# Patient Record
Sex: Female | Born: 1965 | Hispanic: Yes | Marital: Single | Smoking: Current every day smoker
Health system: Southern US, Community
[De-identification: ages and names within clinical notes are randomized; demographics above are authoritative.]

## PROBLEM LIST (undated history)

## (undated) DIAGNOSIS — I10 Essential (primary) hypertension: Secondary | ICD-10-CM

## (undated) DIAGNOSIS — F32A Depression, unspecified: Secondary | ICD-10-CM

## (undated) DIAGNOSIS — F1020 Alcohol dependence, uncomplicated: Secondary | ICD-10-CM

## (undated) DIAGNOSIS — F319 Bipolar disorder, unspecified: Secondary | ICD-10-CM

## (undated) DIAGNOSIS — K219 Gastro-esophageal reflux disease without esophagitis: Secondary | ICD-10-CM

## (undated) DIAGNOSIS — K769 Liver disease, unspecified: Secondary | ICD-10-CM

## (undated) DIAGNOSIS — K859 Acute pancreatitis without necrosis or infection, unspecified: Secondary | ICD-10-CM

## (undated) DIAGNOSIS — F329 Major depressive disorder, single episode, unspecified: Secondary | ICD-10-CM

## (undated) HISTORY — PX: LAPAROSCOPIC UNILATERAL SALPINGO OOPHERECTOMY: SHX5935

## (undated) HISTORY — PX: OVARIAN CYST REMOVAL: SHX89

## (undated) HISTORY — PX: WISDOM TOOTH EXTRACTION: SHX21

## (undated) HISTORY — DX: Liver disease, unspecified: K76.9

## (undated) HISTORY — PX: APPENDECTOMY: SHX54

---

## 2014-06-06 ENCOUNTER — Other Ambulatory Visit: Payer: Self-pay | Admitting: Family Medicine

## 2014-06-06 DIAGNOSIS — R160 Hepatomegaly, not elsewhere classified: Secondary | ICD-10-CM

## 2014-07-04 ENCOUNTER — Other Ambulatory Visit: Payer: Self-pay

## 2014-07-18 DIAGNOSIS — K859 Acute pancreatitis without necrosis or infection, unspecified: Secondary | ICD-10-CM | POA: Insufficient documentation

## 2014-07-18 HISTORY — DX: Acute pancreatitis without necrosis or infection, unspecified: K85.90

## 2014-07-19 ENCOUNTER — Encounter (HOSPITAL_COMMUNITY): Payer: Self-pay

## 2014-07-19 ENCOUNTER — Emergency Department (HOSPITAL_COMMUNITY)
Admission: EM | Admit: 2014-07-19 | Discharge: 2014-07-19 | Disposition: A | Discharge status: Home / Self Care | Payer: Self-pay | Attending: Emergency Medicine | Admitting: Emergency Medicine

## 2014-07-19 DIAGNOSIS — I10 Essential (primary) hypertension: Secondary | ICD-10-CM | POA: Insufficient documentation

## 2014-07-19 DIAGNOSIS — Y908 Blood alcohol level of 240 mg/100 ml or more: Secondary | ICD-10-CM | POA: Insufficient documentation

## 2014-07-19 DIAGNOSIS — K921 Melena: Secondary | ICD-10-CM | POA: Insufficient documentation

## 2014-07-19 DIAGNOSIS — F319 Bipolar disorder, unspecified: Secondary | ICD-10-CM | POA: Insufficient documentation

## 2014-07-19 DIAGNOSIS — F10920 Alcohol use, unspecified with intoxication, uncomplicated: Secondary | ICD-10-CM

## 2014-07-19 DIAGNOSIS — F10120 Alcohol abuse with intoxication, uncomplicated: Secondary | ICD-10-CM | POA: Insufficient documentation

## 2014-07-19 DIAGNOSIS — Z79899 Other long term (current) drug therapy: Secondary | ICD-10-CM | POA: Insufficient documentation

## 2014-07-19 HISTORY — DX: Bipolar disorder, unspecified: F31.9

## 2014-07-19 HISTORY — DX: Essential (primary) hypertension: I10

## 2014-07-19 LAB — COMPREHENSIVE METABOLIC PANEL
ALK PHOS: 168 U/L — AB (ref 39–117)
ALT: 85 U/L — AB (ref 0–35)
AST: 212 U/L — AB (ref 0–37)
Albumin: 4.3 g/dL (ref 3.5–5.2)
Anion gap: 16 — ABNORMAL HIGH (ref 5–15)
BUN: 6 mg/dL (ref 6–23)
CALCIUM: 8.7 mg/dL (ref 8.4–10.5)
CO2: 20 mmol/L (ref 19–32)
Chloride: 101 mmol/L (ref 96–112)
Creatinine, Ser: 0.51 mg/dL (ref 0.50–1.10)
GFR calc Af Amer: >90 mL/min (ref >90)
GFR calc non Af Amer: >90 mL/min (ref >90)
Glucose, Bld: 92 mg/dL (ref 70–99)
Potassium: 2.7 mmol/L — CL (ref 3.5–5.1)
SODIUM: 137 mmol/L (ref 135–145)
Total Bilirubin: 1.2 mg/dL (ref 0.3–1.2)
Total Protein: 8 g/dL (ref 6.0–8.3)

## 2014-07-19 LAB — CBC WITH DIFFERENTIAL/PLATELET
BASOS ABS: 0.1 10*3/uL (ref 0.0–0.1)
Basophils Relative: 0 % (ref 0–1)
EOS PCT: 2 % (ref 0–5)
Eosinophils Absolute: 0.2 10*3/uL (ref 0.0–0.7)
HEMATOCRIT: 36.3 % (ref 36.0–46.0)
Hemoglobin: 12.3 g/dL (ref 12.0–15.0)
LYMPHS PCT: 28 % (ref 12–46)
Lymphs Abs: 3.2 10*3/uL (ref 0.7–4.0)
MCH: 33.4 pg (ref 26.0–34.0)
MCHC: 33.9 g/dL (ref 30.0–36.0)
MCV: 98.6 fL (ref 78.0–100.0)
Monocytes Absolute: 1 10*3/uL (ref 0.1–1.0)
Monocytes Relative: 9 % (ref 3–12)
NEUTROS ABS: 6.9 10*3/uL (ref 1.7–7.7)
Neutrophils Relative %: 61 % (ref 43–77)
Platelets: 313 10*3/uL (ref 150–400)
RBC: 3.68 MIL/uL — AB (ref 3.87–5.11)
RDW: 13.4 % (ref 11.5–15.5)
WBC: 11.3 10*3/uL — ABNORMAL HIGH (ref 4.0–10.5)

## 2014-07-19 LAB — LIPASE, BLOOD: Lipase: 52 U/L (ref 11–59)

## 2014-07-19 LAB — ETHANOL: Alcohol, Ethyl (B): 442 mg/dL (ref 0–9)

## 2014-07-19 MED ORDER — FOLIC ACID 1 MG PO TABS
1.0000 mg | ORAL_TABLET | Freq: Once | ORAL | Status: AC
  Administered 2014-07-19: 1 mg via ORAL
  Filled 2014-07-19: qty 1

## 2014-07-19 MED ORDER — SODIUM CHLORIDE 0.9 % IV BOLUS (SEPSIS)
1000.0000 mL | Freq: Once | INTRAVENOUS | Status: AC
  Administered 2014-07-19: 1000 mL via INTRAVENOUS

## 2014-07-19 MED ORDER — POTASSIUM CHLORIDE 10 MEQ/100ML IV SOLN
10.0000 meq | INTRAVENOUS | Status: DC
  Administered 2014-07-19: 10 meq via INTRAVENOUS
  Filled 2014-07-19: qty 100

## 2014-07-19 MED ORDER — VITAMIN B-1 100 MG PO TABS
500.0000 mg | ORAL_TABLET | Freq: Once | ORAL | Status: AC
  Administered 2014-07-19: 500 mg via ORAL
  Filled 2014-07-19: qty 5

## 2014-07-19 MED ORDER — ONDANSETRON HCL 4 MG/2ML IJ SOLN
4.0000 mg | Freq: Once | INTRAMUSCULAR | Status: AC
  Administered 2014-07-19: 4 mg via INTRAVENOUS
  Filled 2014-07-19: qty 2

## 2014-07-19 MED ORDER — POTASSIUM CHLORIDE CRYS ER 20 MEQ PO TBCR
40.0000 meq | EXTENDED_RELEASE_TABLET | Freq: Once | ORAL | Status: AC
  Administered 2014-07-19: 40 meq via ORAL
  Filled 2014-07-19: qty 2

## 2014-07-19 NOTE — ED Notes (Signed)
Pt requesting to go home. Pt has a neighbor coming to get her.  Pt ambulating around room with steady gait.  Made [facility] EDP aware.  Pt ok top go home.

## 2014-07-19 NOTE — ED Notes (Signed)
Per EMS pt from home and pt called 911 because she believed she was having a seizure because her "phone looked blurry". Pt intoxicated and drinking vodka and smoking pot. Pt in NAD. CBG 117 HR 80's BP 160/90.

## 2014-07-19 NOTE — ED Notes (Signed)
Pt using phone to call her daughter.

## 2014-07-19 NOTE — Discharge Instructions (Signed)
Alcohol Intoxication Alcohol intoxication occurs when you drink enough alcohol that it affects your ability to function. It can be mild or very severe. Drinking a lot of alcohol in a short time is called binge drinking. This can be very harmful. Drinking alcohol can also be more dangerous if you are taking medicines or other drugs. Some of the effects caused by alcohol may include:  Loss of coordination.  Changes in mood and behavior.  Unclear thinking.  Trouble talking (slurred speech).  Throwing up (vomiting).  Confusion.  Slowed breathing.  Twitching and shaking (seizures).  Loss of consciousness. HOME CARE  Do not drive after drinking alcohol.  Drink enough water and fluids to keep your pee (urine) clear or pale yellow. Avoid caffeine.  Only take medicine as told by your doctor. GET HELP IF:  You throw up (vomit) many times.  You do not feel better after a few days.  You frequently have alcohol intoxication. Your doctor can help decide if you should see a substance use treatment counselor. GET HELP RIGHT AWAY IF:  You become shaky when you stop drinking.  You have twitching and shaking.  You throw up blood. It may look bright red or like coffee grounds.  You notice blood in your poop (bowel movements).  You become lightheaded or pass out (faint). MAKE SURE YOU:   Understand these instructions.  Will watch your condition.  Will get help right away if you are not doing well or get worse. Document Released: 11/20/2007 Document Revised: 02/03/2013 Document Reviewed: 11/06/2012 [facility] Patient Information 2015 [place], [place]. This information is not intended to replace advice given to you by your health care provider. Make sure you discuss any questions you have with your health care provider.  Alcohol and Nutrition Nutrition serves two purposes. It provides energy. It also maintains body structure and function. Food supplies energy. It also provides the  building blocks needed to replace worn or damaged cells. Alcoholics often eat poorly. This limits their supply of essential nutrients. This affects energy supply and structure maintenance. Alcohol also affects the body's nutrients in:  Digestion.  Storage.  Using and getting rid of waste products. IMPAIRMENT OF NUTRIENT DIGESTION AND UTILIZATION   Once ingested, food must be broken down into small components (digested). Then it is available for energy. It helps maintain body structure and function. Digestion begins in the mouth. It continues in the stomach and intestines, with help from the pancreas. The nutrients from digested food are absorbed from the intestines into the blood. Then they are carried to the liver. The liver prepares nutrients for:  Immediate use.  Storage and future use.  Alcohol inhibits the breakdown of nutrients into usable molecules.  It decreases secretion of digestive enzymes from the pancreas.  Alcohol impairs nutrient absorption by damaging the cells lining the stomach and intestines.  It also interferes with moving some nutrients into the blood.  In addition, nutritional deficiencies themselves may lead to further absorption problems.  For example, folate deficiency changes the cells that line the small intestine. This impairs how water is absorbed. It also affects absorbed nutrients. These include glucose, sodium, and additional folate.  Even if nutrients are digested and absorbed, alcohol can prevent them from being fully used. It changes their transport, storage, and excretion. Impaired utilization of nutrients by alcoholics is indicated by:  Decreased liver stores of vitamins, such as vitamin A.  Increased excretion of nutrients such as fat. ALCOHOL AND ENERGY SUPPLY   Three basic nutritional  components found in food are:  Carbohydrates.  Proteins.  Fats.  These are used as energy. Some alcoholics take in as much as 50% of their total daily  calories from alcohol. They often neglect important foods.  Even when enough food is eaten, alcohol can impair the ways the body controls blood sugar (glucose) levels. It may either increase or decrease blood sugar.  In non-diabetic alcoholics, increased blood sugar (hyperglycemia) is caused by poor insulin secretion. It is usually temporary.  Decreased blood sugar (hypoglycemia) can cause serious injury even if this condition is short-lived. Low blood sugar can happen when a fasting or malnourished person drinks alcohol. When there is no food to supply energy, stored sugar is used up. The products of alcohol inhibit forming glucose from other compounds such as amino acids. As a result, alcohol causes the brain and other body tissue to lack glucose. It is needed for energy and function.  Alcohol is an energy source. But how the body processes and uses the energy from alcohol is complex. Also, when alcohol is substituted for carbohydrates, subjects tend to lose weight. This indicates that they get less energy from alcohol than from food. ALCOHOL - MAINTAINING CELL STRUCTURE AND FUNCTION  Structure Cells are made mostly of protein. So an adequate protein diet is important for maintaining cell structure. This is especially true if cells are being damaged. Research indicates that alcohol affects protein nutrition by causing impaired:  Digestion of proteins to amino acids.  Processing of amino acids by the small intestine and liver.  Synthesis of proteins from amino acids.  Protein secretion by the liver. Function Nutrients are essential for the body to function well. They provide the tools that the body needs to work well:   Proteins.  Vitamins.  Minerals. Alcohol can disrupt body function. It may cause nutrient deficiencies. And it may interfere with the way nutrients are processed. Vitamins  Vitamins are essential to maintain growth and normal metabolism. They regulate many of the body`s  processes. Chronic heavy drinking causes deficiencies in many vitamins. This is caused by eating less. And, in some cases, vitamins may be poorly absorbed. For example, alcohol inhibits fat absorption. It impairs how the vitamins A, E, and D are normally absorbed along with dietary fats. Not enough vitamin A may cause night blindness. Not enough vitamin D may cause softening of the bones.  Some alcoholics lack vitamins A, C, D, E, K, and the B vitamins. These are all involved in wound healing and cell maintenance. In particular, because vitamin K is necessary for blood clotting, lacking that vitamin can cause delayed clotting. The result is excess bleeding. Lacking other vitamins involved in brain function may cause severe neurological damage. Minerals Deficiencies of minerals such as calcium, magnesium, iron, and zinc are common in alcoholics. The alcohol itself does not seem to affect how these minerals are absorbed. Rather, they seem to occur secondary to other alcohol-related problems, such as:  Less calcium absorbed.  Not enough magnesium.  More urinary excretion.  Vomiting.  Diarrhea.  Not enough iron due to gastrointestinal bleeding.  Not enough zinc or losses related to other nutrient deficiencies.  Mineral deficiencies can cause a variety of medical consequences. These range from calcium-related bone disease to zinc-related night blindness and skin lesions. ALCOHOL, MALNUTRITION, AND MEDICAL COMPLICATIONS  Liver Disease   Alcoholic liver damage is caused primarily by alcohol itself. But poor nutrition may increase the risk of alcohol-related liver damage. For example, nutrients normally found in  the liver are known to be affected by drinking alcohol. These include carotenoids, which are the major sources of vitamin A, and vitamin E compounds. Decreases in such nutrients may play some role in alcohol-related liver damage. Pancreatitis  Research suggests that malnutrition may  increase the risk of developing alcoholic pancreatitis. Research suggests that a diet lacking in protein may increase alcohol's damaging effect on the pancreas. Brain  Nutritional deficiencies may have severe effects on brain function. These may be permanent. Specifically, thiamine deficiencies are often seen in alcoholics. They can cause severe neurological problems. These include:  Impaired movement.  Memory loss seen in Wernicke-Korsakoff syndrome. Pregnancy  Alcohol has toxic effects on fetal development. It causes alcohol-related birth defects. They include fetal alcohol syndrome. Alcohol itself is toxic to the fetus. Also, the nutritional deficiency can affect how the fetus develops. That may compound the risk of developmental damage.  Nutritional needs during pregnancy are 10% to 30% greater than normal. Food intake can increase by as much as 140% to cover the needs of both mother and fetus. An alcoholic mother`s nutritional problems may adversely affect the nutrition of the fetus. And alcohol itself can also restrict nutrition flow to the fetus. NUTRITIONAL STATUS OF ALCOHOLICS  Techniques for assessing nutritional status include:  Taking body measurements to estimate fat reserves. They include:  Weight.  Height.  Mass.  Skin fold thickness.  Performing blood analysis to provide measurements of circulating:  Proteins.  Vitamins.  Minerals.  These techniques tend to be imprecise. For many nutrients, there is no clear "cut-off" point that would allow an accurate definition of deficiency. So assessing the nutritional status of alcoholics is limited by these techniques. Dietary status may provide information about the risk of developing nutritional problems. Dietary status is assessed by:  Taking patients' dietary histories.  Evaluating the amount and types of food they are eating.  It is difficult to determine what exact amount of alcohol begins to have damaging effects  on nutrition. In general, moderate drinkers have 2 drinks or less per day. They seem to be at little risk for nutritional problems. Various medical disorders begin to appear at greater levels.  Research indicates that the majority of even the heaviest drinkers have few obvious nutritional deficiencies. Many alcoholics who are hospitalized for medical complications of their disease do have severe malnutrition. Alcoholics tend to eat poorly. Often they eat less than the amounts of food necessary to provide enough:  Carbohydrates.  Protein.  Fat.  Vitamins A and C.  B vitamins.  Minerals like calcium and iron. Of major concern is alcohol's effect on digesting food and use of nutrients. It may shift a mildly malnourished person toward severe malnutrition. Document Released: 03/28/2005 Document Revised: 08/26/2011 Document Reviewed: 09/11/2005 [facility] Patient Information 2015 [place], [place]. This information is not intended to replace advice given to you by your health care provider. Make sure you discuss any questions you have with your health care provider.

## 2014-07-19 NOTE — ED Notes (Signed)
MD at bedside. 

## 2014-07-19 NOTE — Progress Notes (Signed)
Pt states pcp is Dr Dorthy Cooler at [facility] inquired about "a sedative to calm me. I am a chronic alcoholic." CM spoke with ED RN, Morey Hummingbird about pt request

## 2014-07-19 NOTE — ED Provider Notes (Signed)
Patient signed out to me to follow her progression for alcohol intoxication. It was felt that she could be discharged when she was more alert and sober. Patient is now alert, oriented and ambulatory. She has a friend that is going to pick her up and will take responsibility for her at time of discharge. She is appropriate for discharge.  Orpah Greek, MD 07/19/14 [phone]

## 2014-07-19 NOTE — ED Provider Notes (Signed)
CSN: [id number]     Arrival date & time 07/19/14  1312 History   First MD Initiated Contact with Patient 07/19/14 1325     Chief Complaint  Patient presents with  . Alcohol Intoxication  . Abdominal Pain     (Consider location/radiation/quality/duration/timing/severity/associated sxs/prior Treatment) Patient is a 49 y.o. female presenting with intoxication and abdominal pain. The history is provided by the patient.  Alcohol Intoxication This is a chronic problem. Episode onset: drinking hard liquor for 20 years.  drank earlier today. The problem occurs constantly. The problem has not changed since onset.Associated symptoms include abdominal pain. Pertinent negatives include no chest pain, no headaches and no shortness of breath. Associated symptoms comments: States today while reading a book her vision became blurry and she was concerned she was having a seizure.  Sx gone now.  Also chronic abd pain on right and vomits every morning. Nothing aggravates the symptoms. Nothing relieves the symptoms. She has tried nothing for the symptoms. The treatment provided no relief.  Abdominal Pain Associated symptoms: nausea and vomiting   Associated symptoms: no chest pain, no dysuria, no fever and no shortness of breath     Past Medical History  Diagnosis Date  . Bipolar 1 disorder   . Hypertension    History reviewed. No pertinent past surgical history. History reviewed. No pertinent family history. History  Substance Use Topics  . Smoking status: Current Every Day Smoker  . Smokeless tobacco: Not on file  . Alcohol Use: Yes     Comment: heavily   OB History    No data available     Review of Systems  Constitutional: Negative for fever.  Eyes: Positive for visual disturbance. Negative for photophobia.  Respiratory: Negative for shortness of breath.   Cardiovascular: Negative for chest pain.  Gastrointestinal: Positive for nausea, vomiting and abdominal pain.       Black stools for 2  weeks  Genitourinary: Negative for dysuria.  Neurological: Negative for weakness and headaches.  All other systems reviewed and are negative.     Allergies  Review of patient's allergies indicates no known allergies.  Home Medications   Prior to Admission medications   Medication Sig Start Date End Date Taking? Authorizing Provider  citalopram (CELEXA) 10 MG tablet Take 10 mg by mouth daily.   Yes Historical Provider, MD  divalproex (DEPAKOTE ER) 500 MG 24 hr tablet Take 500 mg by mouth daily.   Yes Historical Provider, MD  LORazepam (ATIVAN) 0.5 MG tablet Take 0.5 mg by mouth 2 (two) times daily.   Yes Historical Provider, MD  losartan-hydrochlorothiazide (HYZAAR) 100-12.5 MG per tablet Take 1 tablet by mouth daily.   Yes Historical Provider, MD  omeprazole (PRILOSEC) 40 MG capsule Take 40 mg by mouth daily.   Yes Historical Provider, MD   BP 119/80 mmHg  Pulse 88  Temp(Src) 97.6 F (36.4 C) (Oral)  Resp 18  SpO2 100% Physical Exam  Constitutional: She is oriented to person, place, and time. She appears well-developed and well-nourished. She appears distressed.  HENT:  Head: Normocephalic and atraumatic.  Right Ear: Tympanic membrane normal.  Left Ear: Tympanic membrane normal.  Eyes: EOM are normal. Pupils are equal, round, and reactive to light.  Cardiovascular: Normal rate, regular rhythm, normal heart sounds and intact distal pulses.  Exam reveals no friction rub.   No murmur heard. Pulmonary/Chest: Effort normal and breath sounds normal. She has no wheezes. She has no rales.  Abdominal: Soft. Bowel sounds are normal.  She exhibits distension and ascites. There is hepatomegaly. There is tenderness in the right upper quadrant and right lower quadrant. There is no rebound and no guarding. No hernia.  Musculoskeletal: Normal range of motion. She exhibits no tenderness.  No edema  Neurological: She is alert and oriented to person, place, and time. She has normal strength. No  cranial nerve deficit or sensory deficit. Gait abnormal.  Intoxicated on my exam.  Stumbling gait  Skin: Skin is warm and dry. No rash noted.  Psychiatric: She has a normal mood and affect. Her behavior is normal.  Nursing note and vitals reviewed.   ED Course  Procedures (including critical care time) Labs Review Labs Reviewed  COMPREHENSIVE METABOLIC PANEL - Abnormal; Notable for the following:    Potassium 2.7 (*)    AST 212 (*)    ALT 85 (*)    Alkaline Phosphatase 168 (*)    Anion gap 16 (*)    All other components within normal limits  CBC WITH DIFFERENTIAL/PLATELET - Abnormal; Notable for the following:    WBC 11.3 (*)    RBC 3.68 (*)    All other components within normal limits  ETHANOL - Abnormal; Notable for the following:    Alcohol, Ethyl (B) 442 (*)    All other components within normal limits  LIPASE, BLOOD  URINALYSIS, ROUTINE W REFLEX MICROSCOPIC  URINE RAPID DRUG SCREEN (HOSP PERFORMED)  POC URINE PREG, ED    Imaging Review No results found.   EKG Interpretation None      MDM   Final diagnoses:  None    Patient with a history of chronic alcohol abuse who admits to drinking heavily every day hard alcohol who presents today because she felt like she could be having a seizure. She states she was reading and her vision got blurry. She denies loss of consciousness, focal weakness or shaking. She was drinking today and appears to be intoxicated at this time. She is also complaining of right-sided abdominal pain and dark stools. She vomits daily in the mornings but denies any hematemesis. On exam she has hepatomegaly with mild distention and ascites. She is not jaundiced at this time. Vital signs are within normal limits.  CBC, CMP, UA, EtOH, UDS, lipase, UPT pending. Patient does not display any symptoms of stroke and gout seizure today. Patient did casually mentioned detox however will have to wait till she is sober to discuss that further. She continues to  ask the nurse if she can go home however she is having difficulty walking at this time do not feel that she can safely be discharged.  3:33 PM Patient is intoxicated with an alcohol level 442, CMP with evidence of hypokalemia of 2.7 and elevated liver enzymes without old test to compare however AST is greater than ALT (212 and 85).  Normal lipase and hemoglobin. With patient's complaint of black tarry stools for 2 weeks feel that she would have an abnormal hemoglobin if she was having GI bleeding.  3:37 PM Pt checked out to Dr. Betsey Holiday.  Pt replaced with K, thiamine and folic acid.  Blanchie Dessert, MD 07/19/14 1538

## 2014-07-19 NOTE — ED Notes (Signed)
Made [facility] EDP aware pt's critical Potassium and ETOH.   Also made EDP aware of pt requesting sedative to help her calm down.  No new orders at this time.

## 2014-07-19 NOTE — ED Notes (Signed)
Pt is aware that a urine sample is needed.  

## 2014-07-25 ENCOUNTER — Encounter (HOSPITAL_COMMUNITY): Payer: Self-pay | Admitting: Emergency Medicine

## 2014-07-25 ENCOUNTER — Emergency Department (HOSPITAL_COMMUNITY)
Admission: EM | Admit: 2014-07-25 | Discharge: 2014-07-25 | Discharge status: Against Medical Advice | Payer: 59 | Attending: Emergency Medicine | Admitting: Emergency Medicine

## 2014-07-25 DIAGNOSIS — R109 Unspecified abdominal pain: Secondary | ICD-10-CM | POA: Diagnosis not present

## 2014-07-25 DIAGNOSIS — R1110 Vomiting, unspecified: Secondary | ICD-10-CM | POA: Insufficient documentation

## 2014-07-25 DIAGNOSIS — Z720 Tobacco use: Secondary | ICD-10-CM | POA: Insufficient documentation

## 2014-07-25 DIAGNOSIS — I10 Essential (primary) hypertension: Secondary | ICD-10-CM | POA: Diagnosis not present

## 2014-07-25 LAB — CBC WITH DIFFERENTIAL/PLATELET
BASOS ABS: 0 10*3/uL (ref 0.0–0.1)
BASOS PCT: 0 % (ref 0–1)
EOS PCT: 1 % (ref 0–5)
Eosinophils Absolute: 0.1 10*3/uL (ref 0.0–0.7)
HCT: 37.2 % (ref 36.0–46.0)
Hemoglobin: 12.8 g/dL (ref 12.0–15.0)
LYMPHS PCT: 15 % (ref 12–46)
Lymphs Abs: 1.4 10*3/uL (ref 0.7–4.0)
MCH: 33.1 pg (ref 26.0–34.0)
MCHC: 34.4 g/dL (ref 30.0–36.0)
MCV: 96.1 fL (ref 78.0–100.0)
Monocytes Absolute: 0.7 10*3/uL (ref 0.1–1.0)
Monocytes Relative: 7 % (ref 3–12)
NEUTROS ABS: 7 10*3/uL (ref 1.7–7.7)
NEUTROS PCT: 77 % (ref 43–77)
Platelets: 280 10*3/uL (ref 150–400)
RBC: 3.87 MIL/uL (ref 3.87–5.11)
RDW: 13.2 % (ref 11.5–15.5)
WBC: 9.1 10*3/uL (ref 4.0–10.5)

## 2014-07-25 LAB — COMPREHENSIVE METABOLIC PANEL
ALT: 88 U/L — ABNORMAL HIGH (ref 0–35)
AST: 250 U/L — ABNORMAL HIGH (ref 0–37)
Albumin: 4.5 g/dL (ref 3.5–5.2)
Alkaline Phosphatase: 211 U/L — ABNORMAL HIGH (ref 39–117)
Anion gap: 21 — ABNORMAL HIGH (ref 5–15)
BILIRUBIN TOTAL: 2.1 mg/dL — AB (ref 0.3–1.2)
BUN: 5 mg/dL — AB (ref 6–23)
CO2: 16 mmol/L — AB (ref 19–32)
CREATININE: 0.77 mg/dL (ref 0.50–1.10)
Calcium: 9.1 mg/dL (ref 8.4–10.5)
Chloride: 97 mmol/L (ref 96–112)
GFR calc Af Amer: >90 mL/min (ref >90)
Glucose, Bld: 77 mg/dL (ref 70–99)
Potassium: 2.8 mmol/L — ABNORMAL LOW (ref 3.5–5.1)
Sodium: 134 mmol/L — ABNORMAL LOW (ref 135–145)
Total Protein: 8.5 g/dL — ABNORMAL HIGH (ref 6.0–8.3)

## 2014-07-25 LAB — ETHANOL: Alcohol, Ethyl (B): 168 mg/dL — ABNORMAL HIGH (ref 0–9)

## 2014-07-25 LAB — LIPASE, BLOOD: LIPASE: 122 U/L — AB (ref 11–59)

## 2014-07-25 LAB — AMYLASE: Amylase: 96 U/L (ref 0–105)

## 2014-07-25 NOTE — ED Notes (Signed)
Pt c/o right sided abd pain and vomiting with some blood and black stools x 1 week; pt sts abd distention and sts drinks approx a pink of vodka per day

## 2014-07-25 NOTE — ED Notes (Signed)
The patient was called several times to be taken to a room and she did not answer.  The patient left without being seen after triage.

## 2014-07-26 ENCOUNTER — Emergency Department (HOSPITAL_COMMUNITY): Payer: 59

## 2014-07-26 ENCOUNTER — Encounter (HOSPITAL_COMMUNITY): Payer: Self-pay | Admitting: *Deleted

## 2014-07-26 ENCOUNTER — Inpatient Hospital Stay (HOSPITAL_COMMUNITY)
Admission: EM | Admit: 2014-07-26 | Discharge: 2014-08-01 | DRG: 439 | Disposition: A | Discharge status: Home / Self Care | Payer: 59 | Attending: Internal Medicine | Admitting: Internal Medicine

## 2014-07-26 DIAGNOSIS — B372 Candidiasis of skin and nail: Secondary | ICD-10-CM | POA: Diagnosis present

## 2014-07-26 DIAGNOSIS — E860 Dehydration: Secondary | ICD-10-CM | POA: Diagnosis present

## 2014-07-26 DIAGNOSIS — K5000 Crohn's disease of small intestine without complications: Secondary | ICD-10-CM | POA: Diagnosis present

## 2014-07-26 DIAGNOSIS — K760 Fatty (change of) liver, not elsewhere classified: Secondary | ICD-10-CM | POA: Diagnosis present

## 2014-07-26 DIAGNOSIS — Z23 Encounter for immunization: Secondary | ICD-10-CM | POA: Diagnosis not present

## 2014-07-26 DIAGNOSIS — D259 Leiomyoma of uterus, unspecified: Secondary | ICD-10-CM | POA: Diagnosis present

## 2014-07-26 DIAGNOSIS — Z79899 Other long term (current) drug therapy: Secondary | ICD-10-CM | POA: Diagnosis not present

## 2014-07-26 DIAGNOSIS — K7010 Alcoholic hepatitis without ascites: Secondary | ICD-10-CM | POA: Diagnosis present

## 2014-07-26 DIAGNOSIS — R7989 Other specified abnormal findings of blood chemistry: Secondary | ICD-10-CM | POA: Diagnosis present

## 2014-07-26 DIAGNOSIS — L304 Erythema intertrigo: Secondary | ICD-10-CM | POA: Diagnosis present

## 2014-07-26 DIAGNOSIS — E349 Endocrine disorder, unspecified: Secondary | ICD-10-CM | POA: Diagnosis present

## 2014-07-26 DIAGNOSIS — K852 Alcohol induced acute pancreatitis without necrosis or infection: Secondary | ICD-10-CM

## 2014-07-26 DIAGNOSIS — E871 Hypo-osmolality and hyponatremia: Secondary | ICD-10-CM | POA: Diagnosis present

## 2014-07-26 DIAGNOSIS — K529 Noninfective gastroenteritis and colitis, unspecified: Secondary | ICD-10-CM | POA: Diagnosis present

## 2014-07-26 DIAGNOSIS — E440 Moderate protein-calorie malnutrition: Secondary | ICD-10-CM | POA: Diagnosis present

## 2014-07-26 DIAGNOSIS — Z6827 Body mass index (BMI) 27.0-27.9, adult: Secondary | ICD-10-CM | POA: Diagnosis not present

## 2014-07-26 DIAGNOSIS — F1020 Alcohol dependence, uncomplicated: Secondary | ICD-10-CM | POA: Diagnosis present

## 2014-07-26 DIAGNOSIS — F17210 Nicotine dependence, cigarettes, uncomplicated: Secondary | ICD-10-CM | POA: Diagnosis present

## 2014-07-26 DIAGNOSIS — R971 Elevated cancer antigen 125 [CA 125]: Secondary | ICD-10-CM | POA: Diagnosis present

## 2014-07-26 DIAGNOSIS — K769 Liver disease, unspecified: Secondary | ICD-10-CM | POA: Diagnosis present

## 2014-07-26 DIAGNOSIS — E876 Hypokalemia: Secondary | ICD-10-CM | POA: Diagnosis present

## 2014-07-26 DIAGNOSIS — I10 Essential (primary) hypertension: Secondary | ICD-10-CM | POA: Diagnosis present

## 2014-07-26 DIAGNOSIS — F319 Bipolar disorder, unspecified: Secondary | ICD-10-CM | POA: Diagnosis present

## 2014-07-26 DIAGNOSIS — D219 Benign neoplasm of connective and other soft tissue, unspecified: Secondary | ICD-10-CM | POA: Diagnosis present

## 2014-07-26 DIAGNOSIS — R109 Unspecified abdominal pain: Secondary | ICD-10-CM | POA: Diagnosis present

## 2014-07-26 HISTORY — DX: Gastro-esophageal reflux disease without esophagitis: K21.9

## 2014-07-26 HISTORY — DX: Alcohol dependence, uncomplicated: F10.20

## 2014-07-26 HISTORY — DX: Acute pancreatitis without necrosis or infection, unspecified: K85.90

## 2014-07-26 LAB — CBC WITH DIFFERENTIAL/PLATELET
BASOS ABS: 0 10*3/uL (ref 0.0–0.1)
BASOS PCT: 0 % (ref 0–1)
EOS ABS: 0 10*3/uL (ref 0.0–0.7)
EOS PCT: 0 % (ref 0–5)
HCT: 38 % (ref 36.0–46.0)
Hemoglobin: 12.9 g/dL (ref 12.0–15.0)
LYMPHS PCT: 15 % (ref 12–46)
Lymphs Abs: 1.3 10*3/uL (ref 0.7–4.0)
MCH: 33.4 pg (ref 26.0–34.0)
MCHC: 33.9 g/dL (ref 30.0–36.0)
MCV: 98.4 fL (ref 78.0–100.0)
Monocytes Absolute: 0.6 10*3/uL (ref 0.1–1.0)
Monocytes Relative: 7 % (ref 3–12)
Neutro Abs: 6.7 10*3/uL (ref 1.7–7.7)
Neutrophils Relative %: 78 % — ABNORMAL HIGH (ref 43–77)
PLATELETS: 260 10*3/uL (ref 150–400)
RBC: 3.86 MIL/uL — ABNORMAL LOW (ref 3.87–5.11)
RDW: 13.3 % (ref 11.5–15.5)
WBC: 8.6 10*3/uL (ref 4.0–10.5)

## 2014-07-26 LAB — I-STAT VENOUS BLOOD GAS, ED
Acid-base deficit: 17 mmol/L — ABNORMAL HIGH (ref 0.0–2.0)
Bicarbonate: 9.3 mEq/L — ABNORMAL LOW (ref 20.0–24.0)
O2 Saturation: 51 %
TCO2: 10 mmol/L (ref 0–100)
pCO2, Ven: 24.8 mmHg — ABNORMAL LOW (ref 45.0–50.0)
pH, Ven: 7.181 — CL (ref 7.250–7.300)
pO2, Ven: 33 mmHg (ref 30.0–45.0)

## 2014-07-26 LAB — COMPREHENSIVE METABOLIC PANEL
ALK PHOS: 199 U/L — AB (ref 39–117)
ALT: 77 U/L — ABNORMAL HIGH (ref 0–35)
AST: 184 U/L — AB (ref 0–37)
Albumin: 4.3 g/dL (ref 3.5–5.2)
Anion gap: 22 — ABNORMAL HIGH (ref 5–15)
BILIRUBIN TOTAL: 2.6 mg/dL — AB (ref 0.3–1.2)
BUN: 6 mg/dL (ref 6–23)
CHLORIDE: 100 mmol/L (ref 96–112)
CO2: 8 mmol/L — CL (ref 19–32)
Calcium: 9.6 mg/dL (ref 8.4–10.5)
Creatinine, Ser: 1.01 mg/dL (ref 0.50–1.10)
GFR calc Af Amer: 75 mL/min — ABNORMAL LOW (ref >90)
GFR calc non Af Amer: 65 mL/min — ABNORMAL LOW (ref >90)
Glucose, Bld: 97 mg/dL (ref 70–99)
Potassium: 3.2 mmol/L — ABNORMAL LOW (ref 3.5–5.1)
Sodium: 130 mmol/L — ABNORMAL LOW (ref 135–145)
Total Protein: 8.4 g/dL — ABNORMAL HIGH (ref 6.0–8.3)

## 2014-07-26 LAB — URINALYSIS, ROUTINE W REFLEX MICROSCOPIC
Glucose, UA: NEGATIVE mg/dL
Ketones, ur: >80 mg/dL — AB
Nitrite: NEGATIVE
Protein, ur: >300 mg/dL — AB
Specific Gravity, Urine: 1.023 (ref 1.005–1.030)
Urobilinogen, UA: 1 mg/dL (ref 0.0–1.0)
pH: 5.5 (ref 5.0–8.0)

## 2014-07-26 LAB — RAPID URINE DRUG SCREEN, HOSP PERFORMED
Amphetamines: NOT DETECTED
Barbiturates: NOT DETECTED
Benzodiazepines: NOT DETECTED
Cocaine: NOT DETECTED
Opiates: NOT DETECTED
Tetrahydrocannabinol: NOT DETECTED

## 2014-07-26 LAB — URINE MICROSCOPIC-ADD ON

## 2014-07-26 LAB — I-STAT CHEM 8, ED
BUN: 4 mg/dL — AB (ref 6–23)
CALCIUM ION: 1.15 mmol/L (ref 1.12–1.23)
CHLORIDE: 102 mmol/L (ref 96–112)
Creatinine, Ser: 0.5 mg/dL (ref 0.50–1.10)
Glucose, Bld: 95 mg/dL (ref 70–99)
HEMATOCRIT: 44 % (ref 36.0–46.0)
Hemoglobin: 15 g/dL (ref 12.0–15.0)
POTASSIUM: 3.4 mmol/L — AB (ref 3.5–5.1)
Sodium: 134 mmol/L — ABNORMAL LOW (ref 135–145)
TCO2: 9 mmol/L (ref 0–100)

## 2014-07-26 LAB — PREGNANCY, URINE: Preg Test, Ur: POSITIVE — AB

## 2014-07-26 LAB — HCG, QUANTITATIVE, PREGNANCY: hCG, Beta Chain, Quant, S: 4 m[IU]/mL (ref <5)

## 2014-07-26 LAB — I-STAT CG4 LACTIC ACID, ED: Lactic Acid, Venous: 0.61 mmol/L (ref 0.5–2.0)

## 2014-07-26 LAB — ETHANOL: Alcohol, Ethyl (B): <5 mg/dL (ref 0–9)

## 2014-07-26 LAB — LIPASE, BLOOD: Lipase: 2120 U/L — ABNORMAL HIGH (ref 11–59)

## 2014-07-26 MED ORDER — ONDANSETRON HCL 4 MG PO TABS
4.0000 mg | ORAL_TABLET | Freq: Once | ORAL | Status: AC
  Administered 2014-07-26: 4 mg via ORAL
  Filled 2014-07-26: qty 1

## 2014-07-26 MED ORDER — LORAZEPAM 2 MG/ML IJ SOLN: 0.0000 mg | Freq: Four times a day (QID) | INTRAMUSCULAR | Status: DC

## 2014-07-26 MED ORDER — VITAMIN B-1 100 MG PO TABS: 100.0000 mg | ORAL_TABLET | Freq: Every day | ORAL | Status: DC

## 2014-07-26 MED ORDER — HYDROMORPHONE HCL 1 MG/ML IJ SOLN
1.0000 mg | Freq: Once | INTRAMUSCULAR | Status: AC
  Administered 2014-07-26: 1 mg via INTRAVENOUS
  Filled 2014-07-26: qty 1

## 2014-07-26 MED ORDER — SODIUM CHLORIDE 0.9 % IV BOLUS (SEPSIS)
1000.0000 mL | Freq: Once | INTRAVENOUS | Status: AC
  Administered 2014-07-26: 1000 mL via INTRAVENOUS

## 2014-07-26 MED ORDER — THIAMINE HCL 100 MG/ML IJ SOLN: 100.0000 mg | Freq: Every day | INTRAMUSCULAR | Status: DC

## 2014-07-26 MED ORDER — OXYCODONE-ACETAMINOPHEN 5-325 MG PO TABS
1.0000 | ORAL_TABLET | Freq: Once | ORAL | Status: AC
  Administered 2014-07-26: 1 via ORAL
  Filled 2014-07-26: qty 1

## 2014-07-26 MED ORDER — ONDANSETRON 4 MG PO TBDP
8.0000 mg | ORAL_TABLET | Freq: Once | ORAL | Status: AC
  Administered 2014-07-26: 8 mg via ORAL
  Filled 2014-07-26: qty 2

## 2014-07-26 MED ORDER — LORAZEPAM 2 MG/ML IJ SOLN: 0.0000 mg | Freq: Two times a day (BID) | INTRAMUSCULAR | Status: DC

## 2014-07-26 MED ORDER — LORAZEPAM 1 MG PO TABS: 0.0000 mg | ORAL_TABLET | Freq: Four times a day (QID) | ORAL | Status: DC

## 2014-07-26 MED ORDER — LORAZEPAM 1 MG PO TABS
0.0000 mg | ORAL_TABLET | Freq: Two times a day (BID) | ORAL | Status: DC
Start: 2014-07-26 — End: 2014-07-26

## 2014-07-26 MED ORDER — IOHEXOL 300 MG/ML  SOLN
25.0000 mL | INTRAMUSCULAR | Status: AC
Start: 2014-07-26 — End: 2014-07-26
  Administered 2014-07-26: 25 mL via ORAL

## 2014-07-26 NOTE — H&P (Signed)
[facility] History and Physical  Leonela Kivi XBM:[id number] DOB: 12-12-1965 DOA: 07/26/2014  Referring physician: Dalia Heading, PA PCP: Lujean Amel, MD   Chief Complaint: Abdominal Pain  HPI: Stacy Pena is a 49 y.o. female presents with abdominal pain. She states that the pain started about a week and a half ago. Patient states the pain is all over her abdomen. She states the pain is severe and burning. She has had vomiting for the last week. She states that she came in yesterday but left without being seen. She states that she has noted blood in her vomit. She also has noted black stool going on for a week. She states that she drinks daily about a liter a day of hard liquor. She state]s that her last drink was this morning. She denies having had any methanol or ethylene glycol. She has had with drawl before. She states that she has not had pancreatitis before., In the ED her Lipase was elevated and also had a positive pregnancy test.   Review of Systems:  Constitutional:  No weight loss, night sweats, Fevers, chills, ++fatigue.  HEENT:  No headaches, Difficulty swallowing,Tooth/dental problems,Sore throat,  No sneezing Cardio-vascular:  No chest pain, Orthopnea, PND, swelling in lower extremities GI:  ++heartburn, ++indigestion, ++abdominal pain, ++nausea, ++vomiting  Resp:  No shortness of breath with exertion or at rest. No excess mucus, no productive cough, No non-productive cough, No coughing up of blood.No change in color of mucus Skin:  no rash or lesions.  GU:  no dysuria, change in color of urine, no urgency or frequency Musculoskeletal:  No joint pain or swelling. No decreased range of motion Psych:  No change in mood or affect. No depression or anxiety  Past Medical History  Diagnosis Date  . Bipolar 1 disorder   . Hypertension   . Alcoholism    History reviewed. No pertinent past surgical history. Social History:  reports that she has been  smoking.  She does not have any smokeless tobacco history on file. She reports that she drinks alcohol. She reports that she does not use illicit drugs.  No Known Allergies  History reviewed. No pertinent family history.   Prior to Admission medications   Medication Sig Start Date End Date Taking? Authorizing Provider  citalopram (CELEXA) 10 MG tablet Take 10 mg by mouth daily.   Yes Historical Provider, MD  divalproex (DEPAKOTE ER) 500 MG 24 hr tablet Take 500 mg by mouth daily.   Yes Historical Provider, MD  LORazepam (ATIVAN) 0.5 MG tablet Take 0.5 mg by mouth 2 (two) times daily.   Yes Historical Provider, MD  losartan-hydrochlorothiazide (HYZAAR) 100-12.5 MG per tablet Take 1 tablet by mouth daily.   Yes Historical Provider, MD  omeprazole (PRILOSEC) 40 MG capsule Take 40 mg by mouth daily.   Yes Historical Provider, MD   Physical Exam: Filed Vitals:   07/26/14 2026 07/26/14 2030 07/26/14 2100 07/26/14 2230  BP: 165/73 172/88 181/93 169/91  Pulse: 98 91 93 97  Temp: 97.9 F (36.6 C)     TempSrc: Oral     Resp: 20 21 21    Weight:      SpO2: 100% 100% 100% 100%    Wt Readings from Last 3 Encounters:  07/26/14 70.081 kg (154 lb 8 oz)    General:  Appears calm and comfortable Eyes: PERRL, normal lids, irises & conjunctiva ENT: grossly normal hearing, lips & tongue Neck: no LAD, masses or thyromegaly Cardiovascular: RRR, no m/r/g. No LE edema.  Respiratory: CTA bilaterally, no w/r/r. Normal respiratory effort. Abdomen: soft, ++tenderness Skin: ++bruising Musculoskeletal: grossly normal tone BUE/BLE Psychiatric: grossly normal mood and affect, speech fluent and appropriate Neurologic: grossly non-focal.          Labs on Admission:  Basic Metabolic Panel:  Recent Labs Lab 07/25/14 1913 07/26/14 1553 07/26/14 1604  NA 134* 130* 134*  K 2.8* 3.2* 3.4*  CL 97 100 102  CO2 16* 8*  --   GLUCOSE 77 97 95  BUN 5* 6 4*  CREATININE 0.77 1.01 0.50  CALCIUM 9.1 9.6  --      Liver Function Tests:  Recent Labs Lab 07/25/14 1913 07/26/14 1553  AST 250* 184*  ALT 88* 77*  ALKPHOS 211* 199*  BILITOT 2.1* 2.6*  PROT 8.5* 8.4*  ALBUMIN 4.5 4.3    Recent Labs Lab 07/25/14 1913 07/26/14 1553  LIPASE 122* 2120*  AMYLASE 96  --    No results for input(s): AMMONIA in the last 168 hours. CBC:  Recent Labs Lab 07/25/14 1913 07/26/14 1553 07/26/14 1604  WBC 9.1 8.6  --   NEUTROABS 7.0 6.7  --   HGB 12.8 12.9 15.0  HCT 37.2 38.0 44.0  MCV 96.1 98.4  --   PLT 280 260  --    Cardiac Enzymes: No results for input(s): CKTOTAL, CKMB, CKMBINDEX, TROPONINI in the last 168 hours.  BNP (last 3 results) No results for input(s): BNP in the last 8760 hours.  ProBNP (last 3 results) No results for input(s): PROBNP in the last 8760 hours.  CBG: No results for input(s): GLUCAP in the last 168 hours.  Radiological Exams on Admission: US Ob Comp Less 14 Wks  07/26/2014   CLINICAL DATA:  Acute onset of generalized abdominal pain and vomiting. Initial encounter.  EXAM: OBSTETRIC <14 WK [place] AND TRANSVAGINAL OB US  TECHNIQUE: Both transabdominal and transvaginal ultrasound examinations were performed for complete evaluation of the gestation as well as the maternal uterus, adnexal regions, and pelvic cul-de-sac. Transvaginal technique was performed to assess early pregnancy.  COMPARISON:  None.  FINDINGS: Intrauterine gestational sac: None seen.  Yolk sac:  N/A  Embryo:  N/A  Maternal uterus/adnexae: Multiple fibroids are noted within the uterus, the largest of which measures 3.9 cm on the left side. A 2.6 cm fibroid is noted on the right. The fibroids partially obscure the endometrial echo complex. The uterus is otherwise unremarkable.  Only a single ovary is visualized, measuring 2.8 x 1.9 x 2.0 cm, slightly to the right. According to the patient, one of the ovaries has been removed. No suspicious adnexal masses are seen. The visualized ovary is grossly unremarkable.   IMPRESSION: 1. No intrauterine gestational sac seen. No evidence for ectopic pregnancy. This is within normal limits, given the very low quantitative beta HCG level. If the quantitative beta HCG level continues to rise, follow-up pelvic ultrasound could be considered in 2 or more weeks. 2. Multiple uterine fibroids noted. The fibroids partially obscure the endometrial echo complex. 3. Visualized ovary is unremarkable in appearance. No evidence for ovarian torsion.   Electronically Signed   By: Garald Balding M.D.   On: 07/26/2014 22:49   US Ob Transvaginal  07/26/2014   CLINICAL DATA:  Acute onset of generalized abdominal pain and vomiting. Initial encounter.  EXAM: OBSTETRIC <14 WK [place] AND TRANSVAGINAL OB US  TECHNIQUE: Both transabdominal and transvaginal ultrasound examinations were performed for complete evaluation of the gestation as well as the maternal uterus, adnexal  regions, and pelvic cul-de-sac. Transvaginal technique was performed to assess early pregnancy.  COMPARISON:  None.  FINDINGS: Intrauterine gestational sac: None seen.  Yolk sac:  N/A  Embryo:  N/A  Maternal uterus/adnexae: Multiple fibroids are noted within the uterus, the largest of which measures 3.9 cm on the left side. A 2.6 cm fibroid is noted on the right. The fibroids partially obscure the endometrial echo complex. The uterus is otherwise unremarkable.  Only a single ovary is visualized, measuring 2.8 x 1.9 x 2.0 cm, slightly to the right. According to the patient, one of the ovaries has been removed. No suspicious adnexal masses are seen. The visualized ovary is grossly unremarkable.  IMPRESSION: 1. No intrauterine gestational sac seen. No evidence for ectopic pregnancy. This is within normal limits, given the very low quantitative beta HCG level. If the quantitative beta HCG level continues to rise, follow-up pelvic ultrasound could be considered in 2 or more weeks. 2. Multiple uterine fibroids noted. The fibroids partially obscure  the endometrial echo complex. 3. Visualized ovary is unremarkable in appearance. No evidence for ovarian torsion.   Electronically Signed   By: Garald Balding M.D.   On: 07/26/2014 22:49      Assessment/Plan Active Problems:   HTN (hypertension)   Abdominal pain   Acute alcoholic pancreatitis   Dehydration   1. Acute Pancreatitis -will be admitted for pain control -bowl rest -hydrate  2. HTN -monitor pressures  3. Chronic Alcoholism -needs to stop drinking -counseling provided    Code Status: Full Code (must indicate code status--if unknown or must be presumed, indicate so) DVT Prophylaxis:SCD Family Communication: none (indicate person spoken with, if applicable, with phone number if by telephone) Disposition Plan: Home (indicate anticipated LOS)  Time spent: 31min  Sabri Teal A [facility] Pager [phone]

## 2014-07-26 NOTE — ED Notes (Signed)
Pt states that her last drink was this morning but she vomited it up.

## 2014-07-26 NOTE — ED Notes (Signed)
Pt reports abdominal pain, black stools and vomiting up blood. Pt states she is an alcoholic, last drink this morning. Pt reports abdomen feels swollen and hard. Pt's RUQ is tender to palpation.

## 2014-07-26 NOTE — ED Provider Notes (Signed)
Medical screening examination/treatment/procedure(s) were conducted as a shared visit with non-physician practitioner(s) and myself.  I personally evaluated the patient during the encounter.   EKG Interpretation   Date/Time:  Tuesday July 26 2014 17:33:15 EST Ventricular Rate:  91 PR Interval:  145 QRS Duration: 107 QT Interval:  385 QTC Calculation: 474 R Axis:   99 Text Interpretation:  Sinus rhythm Probable left atrial enlargement  Inferior infarct, old Baseline wander in lead(s) V3 No old tracing to  compare Confirmed by [facility]  MD, TREY ([id number]) on 07/26/2014 5:50:4 PM      49 year old female with a history of alcoholism presents with epigastric abdominal pain and vomiting.  On exam, well appearing, nontoxic, not distressed, normal respiratory effort, normal perfusion, abdomen soft but tender in epigastrium.  Workup consistent with pancreatitis. Incidentally, she was found to have positive pregnancy test, but near zero quant.  [place] without evidence of IUP or ectopic.  Admitted for acute pancreatitis.   Clinical Impression: 1. Acute alcoholic pancreatitis   2. Abdominal pain   3. Dehydration       Houston Siren III, MD 07/27/14 [phone]

## 2014-07-26 NOTE — ED Notes (Signed)
Pt in waiting.  Nurse first notified of abnormal Chem-8

## 2014-07-26 NOTE — ED Provider Notes (Signed)
CSN: [id number]     Arrival date & time 07/26/14  1528 History   First MD Initiated Contact with Patient 07/26/14 1702     Chief Complaint  Patient presents with  . Abdominal Pain  . Melena  . Hematemesis     (Consider location/radiation/quality/duration/timing/severity/associated sxs/prior Treatment) HPI Patient presents to the emergency department with abdominal pain with black stools and blood-streaked vomit.  The patient states that she is an alcoholic and she drinks a complaint of vodka a day, but she states that her last drink was this morning.  Patient states that her abdomen feels distended and hard.  She is complaining of pain in the upper part of her abdomen, mostly.  Patient states she has not had any headache, blurred vision, weakness, dizziness, back pain, neck pain, dysuria, incontinence, fever, chest pain, shortness of breath, rash, or syncope.  The patient states that she has had abdominal distention.  The patient states she was here yesterday but left without being seen Past Medical History  Diagnosis Date  . Bipolar 1 disorder   . Hypertension   . Alcoholism    History reviewed. No pertinent past surgical history. History reviewed. No pertinent family history. History  Substance Use Topics  . Smoking status: Current Every Day Smoker  . Smokeless tobacco: Not on file  . Alcohol Use: Yes     Comment: heavily   OB History    No data available     Review of Systems  All other systems negative except as documented in the HPI. All pertinent positives and negatives as reviewed in the HPI.  Allergies  Review of patient's allergies indicates no known allergies.  Home Medications   Prior to Admission medications   Medication Sig Start Date End Date Taking? Authorizing Provider  citalopram (CELEXA) 10 MG tablet Take 10 mg by mouth daily.   Yes Historical Provider, MD  divalproex (DEPAKOTE ER) 500 MG 24 hr tablet Take 500 mg by mouth daily.   Yes Historical Provider,  MD  LORazepam (ATIVAN) 0.5 MG tablet Take 0.5 mg by mouth 2 (two) times daily.   Yes Historical Provider, MD  losartan-hydrochlorothiazide (HYZAAR) 100-12.5 MG per tablet Take 1 tablet by mouth daily.   Yes Historical Provider, MD  omeprazole (PRILOSEC) 40 MG capsule Take 40 mg by mouth daily.   Yes Historical Provider, MD   BP 117/98 mmHg  Pulse 92  Temp(Src) 98 F (36.7 C)  Resp 20  Wt 154 lb 8 oz (70.081 kg)  SpO2 100% Physical Exam  Constitutional: She is oriented to person, place, and time. She appears well-developed and well-nourished. No distress.  HENT:  Head: Normocephalic and atraumatic.  Mouth/Throat: Oropharynx is clear and moist.  Eyes: Pupils are equal, round, and reactive to light.  Cardiovascular: Normal rate, regular rhythm and normal heart sounds.  Exam reveals no gallop and no friction rub.   No murmur heard. Pulmonary/Chest: Effort normal and breath sounds normal. No respiratory distress.  Neurological: She is alert and oriented to person, place, and time. She exhibits normal muscle tone. Coordination normal.  Skin: Skin is warm and dry. No rash noted. No erythema.    ED Course  Procedures (including critical care time) Labs Review Labs Reviewed  CBC WITH DIFFERENTIAL/PLATELET - Abnormal; Notable for the following:    RBC 3.86 (*)    Neutrophils Relative % 78 (*)    All other components within normal limits  COMPREHENSIVE METABOLIC PANEL - Abnormal; Notable for the following:  Sodium 130 (*)    Potassium 3.2 (*)    CO2 8 (*)    Total Protein 8.4 (*)    AST 184 (*)    ALT 77 (*)    Alkaline Phosphatase 199 (*)    Total Bilirubin 2.6 (*)    GFR calc non Af Amer 65 (*)    GFR calc Af Amer 75 (*)    Anion gap 22 (*)    All other components within normal limits  LIPASE, BLOOD - Abnormal; Notable for the following:    Lipase 2120 (*)    All other components within normal limits  URINALYSIS, ROUTINE W REFLEX MICROSCOPIC - Abnormal; Notable for the  following:    Color, Urine ORANGE (*)    Hgb urine dipstick SMALL (*)    Bilirubin Urine MODERATE (*)    Ketones, ur >80 (*)    Protein, ur >300 (*)    Leukocytes, UA TRACE (*)    All other components within normal limits  URINE MICROSCOPIC-ADD ON - Abnormal; Notable for the following:    Bacteria, UA FEW (*)    Casts HYALINE CASTS (*)    All other components within normal limits  I-STAT CHEM 8, ED - Abnormal; Notable for the following:    Sodium 134 (*)    Potassium 3.4 (*)    BUN 4 (*)    All other components within normal limits  I-STAT VENOUS BLOOD GAS, ED - Abnormal; Notable for the following:    pH, Ven 7.181 (*)    pCO2, Ven 24.8 (*)    Bicarbonate 9.3 (*)    Acid-base deficit 17.0 (*)    All other components within normal limits  URINE CULTURE  URINE RAPID DRUG SCREEN (HOSP PERFORMED)  ETHANOL  PREGNANCY, URINE  I-STAT CG4 LACTIC ACID, ED  POC OCCULT BLOOD, ED    Imaging Review No results found.   EKG Interpretation   Date/Time:  Tuesday July 26 2014 17:33:15 EST Ventricular Rate:  91 PR Interval:  145 QRS Duration: 107 QT Interval:  385 QTC Calculation: 474 R Axis:   99 Text Interpretation:  Sinus rhythm Probable left atrial enlargement  Inferior infarct, old Baseline wander in lead(s) V3 No old tracing to  compare Confirmed by [facility]  MD, TREY ([id number]) on 07/26/2014 5:50:40 PM     I spoke with the [facility] will be down to admit the patient.  The patient had several abnormalities that could [place] from getting a CT scan.  Initially her vital signs been stable.  She has been mentating appropriately.  I feel that her issue is severe and profound dehydration along with pancreatitis MDM   Final diagnoses:  None        Brent General, PA-C 07/27/14 0007  Houston Siren III, MD 07/27/14 [phone]

## 2014-07-27 ENCOUNTER — Inpatient Hospital Stay (HOSPITAL_COMMUNITY): Payer: 59

## 2014-07-27 ENCOUNTER — Encounter (HOSPITAL_COMMUNITY): Payer: Self-pay | Admitting: Radiology

## 2014-07-27 DIAGNOSIS — K769 Liver disease, unspecified: Secondary | ICD-10-CM | POA: Diagnosis present

## 2014-07-27 DIAGNOSIS — D219 Benign neoplasm of connective and other soft tissue, unspecified: Secondary | ICD-10-CM | POA: Diagnosis present

## 2014-07-27 DIAGNOSIS — E349 Endocrine disorder, unspecified: Secondary | ICD-10-CM | POA: Diagnosis present

## 2014-07-27 DIAGNOSIS — K5000 Crohn's disease of small intestine without complications: Secondary | ICD-10-CM | POA: Diagnosis present

## 2014-07-27 DIAGNOSIS — E876 Hypokalemia: Secondary | ICD-10-CM | POA: Diagnosis present

## 2014-07-27 DIAGNOSIS — R7989 Other specified abnormal findings of blood chemistry: Secondary | ICD-10-CM | POA: Diagnosis present

## 2014-07-27 LAB — COMPREHENSIVE METABOLIC PANEL
ALT: 60 U/L — AB (ref 0–35)
ANION GAP: 14 (ref 5–15)
AST: 135 U/L — ABNORMAL HIGH (ref 0–37)
Albumin: 3.6 g/dL (ref 3.5–5.2)
Alkaline Phosphatase: 167 U/L — ABNORMAL HIGH (ref 39–117)
BUN: <5 mg/dL — ABNORMAL LOW (ref 6–23)
CALCIUM: 8.2 mg/dL — AB (ref 8.4–10.5)
CO2: 12 mmol/L — ABNORMAL LOW (ref 19–32)
Chloride: 107 mmol/L (ref 96–112)
Creatinine, Ser: 1.01 mg/dL (ref 0.50–1.10)
GFR, EST AFRICAN AMERICAN: 75 mL/min — AB (ref >90)
GFR, EST NON AFRICAN AMERICAN: 65 mL/min — AB (ref >90)
GLUCOSE: 104 mg/dL — AB (ref 70–99)
Potassium: 3.3 mmol/L — ABNORMAL LOW (ref 3.5–5.1)
Sodium: 133 mmol/L — ABNORMAL LOW (ref 135–145)
Total Bilirubin: 2.6 mg/dL — ABNORMAL HIGH (ref 0.3–1.2)
Total Protein: 6.8 g/dL (ref 6.0–8.3)

## 2014-07-27 LAB — CBC
HCT: 36.2 % (ref 36.0–46.0)
Hemoglobin: 12.2 g/dL (ref 12.0–15.0)
MCH: 33 pg (ref 26.0–34.0)
MCHC: 33.7 g/dL (ref 30.0–36.0)
MCV: 97.8 fL (ref 78.0–100.0)
Platelets: 234 10*3/uL (ref 150–400)
RBC: 3.7 MIL/uL — ABNORMAL LOW (ref 3.87–5.11)
RDW: 13.2 % (ref 11.5–15.5)
WBC: 9.5 10*3/uL (ref 4.0–10.5)

## 2014-07-27 LAB — CBG MONITORING, ED: Glucose-Capillary: 88 mg/dL (ref 70–99)

## 2014-07-27 LAB — OCCULT BLOOD, POC DEVICE: Fecal Occult Bld: POSITIVE — AB

## 2014-07-27 LAB — TSH: TSH: 4.726 u[IU]/mL — ABNORMAL HIGH (ref 0.350–4.500)

## 2014-07-27 LAB — MAGNESIUM: MAGNESIUM: 1.6 mg/dL (ref 1.5–2.5)

## 2014-07-27 MED ORDER — PANTOPRAZOLE SODIUM 40 MG PO TBEC
80.0000 mg | DELAYED_RELEASE_TABLET | Freq: Every day | ORAL | Status: DC
  Administered 2014-07-28 – 2014-07-29 (×2): 80 mg via ORAL
  Filled 2014-07-27 (×3): qty 2

## 2014-07-27 MED ORDER — CITALOPRAM HYDROBROMIDE 10 MG PO TABS
10.0000 mg | ORAL_TABLET | Freq: Every day | ORAL | Status: DC
  Administered 2014-07-28 – 2014-08-01 (×5): 10 mg via ORAL
  Filled 2014-07-27 (×5): qty 1

## 2014-07-27 MED ORDER — HYDROMORPHONE HCL 1 MG/ML IJ SOLN
INTRAMUSCULAR | Status: AC
  Filled 2014-07-27: qty 1

## 2014-07-27 MED ORDER — SODIUM CHLORIDE 0.9 % IV BOLUS (SEPSIS)
1000.0000 mL | Freq: Once | INTRAVENOUS | Status: AC
  Administered 2014-07-27: 1000 mL via INTRAVENOUS

## 2014-07-27 MED ORDER — ADULT MULTIVITAMIN W/MINERALS CH: 1.0000 | ORAL_TABLET | Freq: Every day | ORAL | Status: DC

## 2014-07-27 MED ORDER — LOSARTAN POTASSIUM-HCTZ 100-12.5 MG PO TABS: 1.0000 | ORAL_TABLET | Freq: Every day | ORAL | Status: DC

## 2014-07-27 MED ORDER — VITAMIN B-1 100 MG PO TABS: 100.0000 mg | ORAL_TABLET | Freq: Every day | ORAL | Status: DC

## 2014-07-27 MED ORDER — AMLODIPINE BESYLATE 5 MG PO TABS
5.0000 mg | ORAL_TABLET | Freq: Every day | ORAL | Status: DC
  Filled 2014-07-27: qty 1

## 2014-07-27 MED ORDER — LORAZEPAM 2 MG/ML IJ SOLN: 1.0000 mg | Freq: Four times a day (QID) | INTRAMUSCULAR | Status: DC | PRN

## 2014-07-27 MED ORDER — GADOBENATE DIMEGLUMINE 529 MG/ML IV SOLN
15.0000 mL | Freq: Once | INTRAVENOUS | Status: AC
  Administered 2014-07-27: 15 mL via INTRAVENOUS

## 2014-07-27 MED ORDER — HYDROCHLOROTHIAZIDE 12.5 MG PO CAPS
12.5000 mg | ORAL_CAPSULE | Freq: Every day | ORAL | Status: DC
Start: 2014-07-27 — End: 2014-07-31
  Administered 2014-07-28 – 2014-07-31 (×4): 12.5 mg via ORAL
  Filled 2014-07-27 (×4): qty 1

## 2014-07-27 MED ORDER — FOLIC ACID 1 MG PO TABS: 1.0000 mg | ORAL_TABLET | Freq: Every day | ORAL | Status: DC

## 2014-07-27 MED ORDER — PNEUMOCOCCAL VAC POLYVALENT 25 MCG/0.5ML IJ INJ
0.5000 mL | INJECTION | INTRAMUSCULAR | Status: AC
  Administered 2014-07-28: 0.5 mL via INTRAMUSCULAR
  Filled 2014-07-27: qty 0.5

## 2014-07-27 MED ORDER — LORAZEPAM 1 MG PO TABS: 1.0000 mg | ORAL_TABLET | Freq: Four times a day (QID) | ORAL | Status: DC | PRN

## 2014-07-27 MED ORDER — VITAMIN B-1 100 MG PO TABS
100.0000 mg | ORAL_TABLET | Freq: Every day | ORAL | Status: DC
  Administered 2014-07-28 – 2014-08-01 (×5): 100 mg via ORAL
  Filled 2014-07-27 (×5): qty 1

## 2014-07-27 MED ORDER — THIAMINE HCL 100 MG/ML IJ SOLN
Freq: Once | INTRAVENOUS | Status: AC
  Administered 2014-07-27: 07:00:00 via INTRAVENOUS
  Filled 2014-07-27 (×2): qty 1000

## 2014-07-27 MED ORDER — FOLIC ACID 1 MG PO TABS
1.0000 mg | ORAL_TABLET | Freq: Every day | ORAL | Status: DC
Start: 2014-07-27 — End: 2014-08-01
  Administered 2014-07-28 – 2014-08-01 (×5): 1 mg via ORAL
  Filled 2014-07-27 (×5): qty 1

## 2014-07-27 MED ORDER — SODIUM CHLORIDE 0.9 % IV SOLN
INTRAVENOUS | Status: DC
Start: 2014-07-27 — End: 2014-07-28
  Administered 2014-07-27 – 2014-07-28 (×2): via INTRAVENOUS

## 2014-07-27 MED ORDER — CIPROFLOXACIN IN D5W 400 MG/200ML IV SOLN
400.0000 mg | Freq: Two times a day (BID) | INTRAVENOUS | Status: DC
  Administered 2014-07-27 – 2014-07-31 (×9): 400 mg via INTRAVENOUS
  Filled 2014-07-27 (×11): qty 200

## 2014-07-27 MED ORDER — ONDANSETRON HCL 4 MG/2ML IJ SOLN
4.0000 mg | Freq: Four times a day (QID) | INTRAMUSCULAR | Status: DC | PRN
  Administered 2014-07-28: 4 mg via INTRAVENOUS
  Filled 2014-07-27: qty 2

## 2014-07-27 MED ORDER — LOSARTAN POTASSIUM 50 MG PO TABS
100.0000 mg | ORAL_TABLET | Freq: Every day | ORAL | Status: DC
  Administered 2014-07-28 – 2014-08-01 (×5): 100 mg via ORAL
  Filled 2014-07-27 (×6): qty 2

## 2014-07-27 MED ORDER — SODIUM CHLORIDE 0.9 % IJ SOLN
3.0000 mL | Freq: Two times a day (BID) | INTRAMUSCULAR | Status: DC
Start: 2014-07-27 — End: 2014-08-01
  Administered 2014-07-29: 3 mL via INTRAVENOUS

## 2014-07-27 MED ORDER — THIAMINE HCL 100 MG/ML IJ SOLN
100.0000 mg | Freq: Every day | INTRAMUSCULAR | Status: DC
  Filled 2014-07-27 (×2): qty 1
  Filled 2014-07-27: qty 2
  Filled 2014-07-27 (×3): qty 1

## 2014-07-27 MED ORDER — ONDANSETRON HCL 4 MG PO TABS: 4.0000 mg | ORAL_TABLET | Freq: Four times a day (QID) | ORAL | Status: DC | PRN

## 2014-07-27 MED ORDER — ADULT MULTIVITAMIN W/MINERALS CH
1.0000 | ORAL_TABLET | Freq: Every day | ORAL | Status: DC
Start: 2014-07-27 — End: 2014-08-01
  Administered 2014-07-28 – 2014-08-01 (×5): 1 via ORAL
  Filled 2014-07-27 (×5): qty 1

## 2014-07-27 MED ORDER — HYDROMORPHONE HCL 1 MG/ML IJ SOLN
0.5000 mg | INTRAMUSCULAR | Status: DC | PRN
  Administered 2014-07-27 – 2014-07-29 (×13): 0.5 mg via INTRAVENOUS
  Filled 2014-07-27 (×12): qty 1

## 2014-07-27 MED ORDER — METRONIDAZOLE IN NACL 5-0.79 MG/ML-% IV SOLN
500.0000 mg | Freq: Three times a day (TID) | INTRAVENOUS | Status: DC
  Administered 2014-07-27 – 2014-07-31 (×13): 500 mg via INTRAVENOUS
  Filled 2014-07-27 (×16): qty 100

## 2014-07-27 MED ORDER — IOHEXOL 300 MG/ML  SOLN
100.0000 mL | Freq: Once | INTRAMUSCULAR | Status: AC | PRN
  Administered 2014-07-27: 100 mL via INTRAVENOUS

## 2014-07-27 MED ORDER — DIVALPROEX SODIUM ER 500 MG PO TB24
500.0000 mg | ORAL_TABLET | Freq: Every day | ORAL | Status: DC
  Administered 2014-07-28 – 2014-08-01 (×5): 500 mg via ORAL
  Filled 2014-07-27 (×6): qty 1

## 2014-07-27 NOTE — ED Notes (Signed)
Patient transported to MRI 

## 2014-07-27 NOTE — Progress Notes (Signed)
[facility] PROGRESS NOTE  Stacy Pena DTO:[id number] DOB: Jul 28, 1965 DOA: 07/26/2014 PCP: Lujean Amel, MD  Summary I have seen and examined Stacy Pena at bedside and reviewed her chart. Stacy Pena is a pleasant 49 year old female with history of alcoholism/essential hypertension who came in with worsening abdominal pain and was found to have acute pancreatitis felt to be alcoholic in origin, with CT abdomen and pelvis showing "Infiltration edema around the pancreas consistent with acute pancreatitis. No complication is identified. Probable reactive inflammation of the terminal ileum. Diffuse fatty infiltration of the liver. Focal hyperenhancing lesion in the posterior segment right lobe of the liver probably represents focal nodular hyperplasia but other lesions not excluded. Consider follow-up with elective MRI". The liver lesion is about 2.2 cm in diameter. She also had elevated beta-hCG which prompted pelvic/vaginal ultrasound, which was unremarkable. Her TSH was slightly elevated. She remains quite tender in the abdomen. Question is whether she might have infection in the ileum as well as opposed to a reaction to the pancreatitis. Will obtain MRCP/CA 125/lipid panel/alpha-fetoprotein/free T4&sedimentation rate. Will keep nothing by mouth for now, continue IV fluids, analgesics, give antibiotics and consider GI consultation. She will need to follow up with gynecology once discharged. Plan Acute alcoholic pancreatitis/Ileitis, terminal/Abdominal pain/Dehydration/Acute hypokalemia/Liver lesion  MRCP/sedimentation rate/lipid panel/alpha-fetoprotein  Continue IV fluids/pain medications/Flagyl/ciprofloxacin  Patient can have ice chips.  Keep nothing by mouth. Monitor lipase.  Watch for DTs  Replenish electrolytes as necessary HTN (hypertension), benign  Uncontrolled, element of pain  Hold ARB/diuretic while patient nothing by mouth  Norvasc for now Elevated TSH  Check free  T4 Fibroids/Elevated serum human chorionic gonadotropin (hCG) level in female, not pregnant  Check CA-125  We'll need to follow with gynecology at discharge. Code Status: Full code. Family Communication: None at bedside Disposition Plan: To MedSurg floor in bed becomes available.    Consultants:  None  Procedures:  None  Antibiotics:  Flagyl 07/27/2014>  Ciprofloxacin 07/27/2014>  HPI/Subjective: Complains of abdominal pain and thirst. Denies any cough or diarrhea or vomiting.  Objective: Filed Vitals:   07/27/14 0745  BP: 158/68  Pulse: 97  Temp:   Resp: 20   No intake or output data in the 24 hours ending 07/27/14 0834 Filed Weights   07/26/14 1546  Weight: 70.081 kg (154 lb 8 oz)    Exam:   General:  Comfortable addressed.  Cardiovascular: Regular tachycardia. S1-S2 normal. No murmurs.  Respiratory: Lungs clear.  Abdomen: Diffuse tenderness to palpation. Abdominal distention.  Musculoskeletal: No pedal edema.  Data Reviewed: Basic Metabolic Panel:  Recent Labs Lab 07/25/14 1913 07/26/14 1553 07/26/14 1604 07/27/14 0247  NA 134* 130* 134* 133*  K 2.8* 3.2* 3.4* 3.3*  CL 97 100 102 107  CO2 16* 8*  --  12*  GLUCOSE 77 97 95 104*  BUN 5* 6 4* <5*  CREATININE 0.77 1.01 0.50 1.01  CALCIUM 9.1 9.6  --  8.2*   Liver Function Tests:  Recent Labs Lab 07/25/14 1913 07/26/14 1553 07/27/14 0247  AST 250* 184* 135*  ALT 88* 77* 60*  ALKPHOS 211* 199* 167*  BILITOT 2.1* 2.6* 2.6*  PROT 8.5* 8.4* 6.8  ALBUMIN 4.5 4.3 3.6    Recent Labs Lab 07/25/14 1913 07/26/14 1553  LIPASE 122* 2120*  AMYLASE 96  --    No results for input(s): AMMONIA in the last 168 hours. CBC:  Recent Labs Lab 07/25/14 1913 07/26/14 1553 07/26/14 1604 07/27/14 0247  WBC 9.1 8.6  --  9.5  NEUTROABS 7.0 6.7  --   --   HGB 12.8 12.9 15.0 12.2  HCT 37.2 38.0 44.0 36.2  MCV 96.1 98.4  --  97.8  PLT 280 260  --  234   Cardiac Enzymes: No results for  input(s): CKTOTAL, CKMB, CKMBINDEX, TROPONINI in the last 168 hours. BNP (last 3 results) No results for input(s): BNP in the last 8760 hours.  ProBNP (last 3 results) No results for input(s): PROBNP in the last 8760 hours.  CBG: No results for input(s): GLUCAP in the last 168 hours.  No results found for this or any previous visit (from the past 240 hour(s)).   Studies: US Ob Comp Less 14 Wks  08-16-2014   CLINICAL DATA:  Acute onset of generalized abdominal pain and vomiting. Initial encounter.  EXAM: OBSTETRIC <14 WK [place] AND TRANSVAGINAL OB US  TECHNIQUE: Both transabdominal and transvaginal ultrasound examinations were performed for complete evaluation of the gestation as well as the maternal uterus, adnexal regions, and pelvic cul-de-sac. Transvaginal technique was performed to assess early pregnancy.  COMPARISON:  None.  FINDINGS: Intrauterine gestational sac: None seen.  Yolk sac:  N/A  Embryo:  N/A  Maternal uterus/adnexae: Multiple fibroids are noted within the uterus, the largest of which measures 3.9 cm on the left side. A 2.6 cm fibroid is noted on the right. The fibroids partially obscure the endometrial echo complex. The uterus is otherwise unremarkable.  Only a single ovary is visualized, measuring 2.8 x 1.9 x 2.0 cm, slightly to the right. According to the patient, one of the ovaries has been removed. No suspicious adnexal masses are seen. The visualized ovary is grossly unremarkable.  IMPRESSION: 1. No intrauterine gestational sac seen. No evidence for ectopic pregnancy. This is within normal limits, given the very low quantitative beta HCG level. If the quantitative beta HCG level continues to rise, follow-up pelvic ultrasound could be considered in 2 or more weeks. 2. Multiple uterine fibroids noted. The fibroids partially obscure the endometrial echo complex. 3. Visualized ovary is unremarkable in appearance. No evidence for ovarian torsion.   Electronically Signed   By: Garald Balding M.D.   On: August 16, 2014 22:49   US Ob Transvaginal  2014-08-16   CLINICAL DATA:  Acute onset of generalized abdominal pain and vomiting. Initial encounter.  EXAM: OBSTETRIC <14 WK [place] AND TRANSVAGINAL OB US  TECHNIQUE: Both transabdominal and transvaginal ultrasound examinations were performed for complete evaluation of the gestation as well as the maternal uterus, adnexal regions, and pelvic cul-de-sac. Transvaginal technique was performed to assess early pregnancy.  COMPARISON:  None.  FINDINGS: Intrauterine gestational sac: None seen.  Yolk sac:  N/A  Embryo:  N/A  Maternal uterus/adnexae: Multiple fibroids are noted within the uterus, the largest of which measures 3.9 cm on the left side. A 2.6 cm fibroid is noted on the right. The fibroids partially obscure the endometrial echo complex. The uterus is otherwise unremarkable.  Only a single ovary is visualized, measuring 2.8 x 1.9 x 2.0 cm, slightly to the right. According to the patient, one of the ovaries has been removed. No suspicious adnexal masses are seen. The visualized ovary is grossly unremarkable.  IMPRESSION: 1. No intrauterine gestational sac seen. No evidence for ectopic pregnancy. This is within normal limits, given the very low quantitative beta HCG level. If the quantitative beta HCG level continues to rise, follow-up pelvic ultrasound could be considered in 2 or more weeks. 2. Multiple uterine fibroids noted. The fibroids partially obscure  the endometrial echo complex. 3. Visualized ovary is unremarkable in appearance. No evidence for ovarian torsion.   Electronically Signed   By: Garald Balding M.D.   On: 07/26/2014 22:49   Ct Abdomen Pelvis W Contrast  07/27/2014   CLINICAL DATA:  Abdominal pain. Melena. Hematemesis. Black stools for 1.5 weeks.  EXAM: CT ABDOMEN AND PELVIS WITH CONTRAST  TECHNIQUE: Multidetector CT imaging of the abdomen and pelvis was performed using the standard protocol following bolus administration of intravenous  contrast.  CONTRAST:  100 mL Omnipaque 300  COMPARISON:  None.  FINDINGS: Lung bases are clear.  Diffuse fatty infiltration of the liver. Hepatic enlargement. Focal hyperenhancing lesion in the posterior segment right lobe of the liver measuring 2.2 cm diameter. This is most likely to represent focal nodular hyperplasia, but the appearance is nonspecific and adenoma, or other primary or metastatic lesions are not excluded. Suggest elective MRI for further evaluation. There is diffuse infiltration and edema around the pancreas consistent with acute pancreatitis. Normal enhancement of the pancreatic parenchyma. No evidence of pancreatic necrosis. No loculated fluid collection or pseudocyst. No pancreatic ductal dilatation.  Gallbladder, spleen, adrenal glands, kidneys, abdominal aorta, inferior vena cava, and retroperitoneal lymph nodes are unremarkable. Stomach and small bowel are decompressed. Contrast material flows through to the colon without evidence of bowel obstruction. There is suggestion of small bowel wall thickening in the terminal ileum which could represent inflammatory or reactive process. Colon is decompressed. No free air in the abdomen.  Pelvis: Uterus demonstrates mild nodular prominence suggesting fibroids. No abnormal adnexal masses. Bladder wall is not thickened. No free or loculated pelvic fluid collections. Degenerative changes in the spine. No destructive bone lesions.  IMPRESSION: Infiltration edema around the pancreas consistent with acute pancreatitis. No complication is identified. Probable reactive inflammation of the terminal ileum.  Diffuse fatty infiltration of the liver. Focal hyperenhancing lesion in the posterior segment right lobe of the liver probably represents focal nodular hyperplasia but other lesions not excluded. Consider follow-up with elective MRI.   Electronically Signed   By: Lucienne Capers M.D.   On: 07/27/2014 01:49    Scheduled Meds: . citalopram  10 mg Oral  Daily  . divalproex  500 mg Oral Daily  . folic acid  1 mg Oral Daily  . folic acid  1 mg Oral Daily  . losartan  100 mg Oral Daily   And  . hydrochlorothiazide  12.5 mg Oral Daily  . multivitamin with minerals  1 tablet Oral Daily  . multivitamin with minerals  1 tablet Oral Daily  . pantoprazole  80 mg Oral Daily  . sodium chloride  3 mL Intravenous Q12H  . thiamine  100 mg Oral Daily   Or  . thiamine  100 mg Intravenous Daily  . thiamine  100 mg Oral Daily   Continuous Infusions: . sodium chloride 75 mL/hr at 07/27/14 0412  . ciprofloxacin    . metronidazole        Time spent: 25 minutes    Collins Kerby  [facility] Pager [phone]. If 7PM-7AM, please contact night-coverage at [email or web address], password [facility] 07/27/2014, 8:34 AM  LOS: 1 day

## 2014-07-27 NOTE — Progress Notes (Signed)
Patient has positive pregnancy test. MRI MRCP was ordered with contrast. Consulted with radiologist Dr. Janeece Fitting who advised that I speak with the ordering physician and patient prior to performing the MRI due to the positive pregnancy test. I spoke to Dr Sanjuana Letters who was confident the patient was not pregnant due to negative [place] and patient stating she was not sexually active. Upon arrival to MRI, I discussed the possibility of being pregnant due to positive pregnancy test and our protocol is not to perform the MRI and administer contrast if the patient is pregnant. She has the right to refuse the exam due to potiential risks to the fetus. The patient was agreeable to proceed given the risks if she was indeed pregnant. She stated that she had not had sex in over a year feeling confident she is not pregnant.

## 2014-07-27 NOTE — Clinical Documentation Improvement (Signed)
Please clarify the underlying etiology of the abnormal Na+ level in setting of ETOH pancreatitis and hypokalemia requiring NS bolus of 1,000 ml  Component      Sodium  Latest Ref Rng      135 - 145 mmol/L  07/26/2014     3:53 PM 130 (L)  07/26/2014     4:04 PM 134 (L)   Component      Sodium  Latest Ref Rng      135 - 145 mmol/L  07/27/2014      133 (L)     Possible Clinical Conditions?                                   Other Condition___________________                 Cannot Clinically Determine_________   Supporting Information: Risk Factors:    Treatment:  sodium chloride 0.9 % bolus 1,000 mL Thank You, Heloise Beecham ,RN Clinical Documentation Specialist:  [facility] [employer]

## 2014-07-27 NOTE — ED Notes (Signed)
Patient returned from MRI.

## 2014-07-28 DIAGNOSIS — R946 Abnormal results of thyroid function studies: Secondary | ICD-10-CM

## 2014-07-28 DIAGNOSIS — K7689 Other specified diseases of liver: Secondary | ICD-10-CM

## 2014-07-28 DIAGNOSIS — K50019 Crohn's disease of small intestine with unspecified complications: Secondary | ICD-10-CM

## 2014-07-28 DIAGNOSIS — E876 Hypokalemia: Secondary | ICD-10-CM

## 2014-07-28 LAB — SEDIMENTATION RATE: Sed Rate: 45 mm/hr — ABNORMAL HIGH (ref 0–22)

## 2014-07-28 LAB — COMPREHENSIVE METABOLIC PANEL
ALBUMIN: 3 g/dL — AB (ref 3.5–5.2)
ALK PHOS: 136 U/L — AB (ref 39–117)
ALT: 40 U/L — ABNORMAL HIGH (ref 0–35)
AST: 100 U/L — AB (ref 0–37)
Anion gap: 8 (ref 5–15)
BILIRUBIN TOTAL: 1.7 mg/dL — AB (ref 0.3–1.2)
BUN: <5 mg/dL — ABNORMAL LOW (ref 6–23)
CO2: 17 mmol/L — AB (ref 19–32)
CREATININE: 0.6 mg/dL (ref 0.50–1.10)
Calcium: 8.7 mg/dL (ref 8.4–10.5)
Chloride: 108 mmol/L (ref 96–112)
GFR calc Af Amer: >90 mL/min (ref >90)
Glucose, Bld: 88 mg/dL (ref 70–99)
Potassium: 2.6 mmol/L — CL (ref 3.5–5.1)
Sodium: 133 mmol/L — ABNORMAL LOW (ref 135–145)
Total Protein: 6.1 g/dL (ref 6.0–8.3)

## 2014-07-28 LAB — URINE CULTURE
Colony Count: NO GROWTH
Culture: NO GROWTH

## 2014-07-28 LAB — LIPID PANEL
CHOL/HDL RATIO: 8.2 ratio
CHOLESTEROL: 204 mg/dL — AB (ref 0–200)
HDL: 25 mg/dL — ABNORMAL LOW (ref >39)
LDL Cholesterol: 154 mg/dL — ABNORMAL HIGH (ref 0–99)
TRIGLYCERIDES: 127 mg/dL (ref <150)
VLDL: 25 mg/dL (ref 0–40)

## 2014-07-28 LAB — CBC
HCT: 31.8 % — ABNORMAL LOW (ref 36.0–46.0)
Hemoglobin: 11.1 g/dL — ABNORMAL LOW (ref 12.0–15.0)
MCH: 33.3 pg (ref 26.0–34.0)
MCHC: 34.9 g/dL (ref 30.0–36.0)
MCV: 95.5 fL (ref 78.0–100.0)
Platelets: 184 10*3/uL (ref 150–400)
RBC: 3.33 MIL/uL — ABNORMAL LOW (ref 3.87–5.11)
RDW: 13.2 % (ref 11.5–15.5)
WBC: 6.9 10*3/uL (ref 4.0–10.5)

## 2014-07-28 LAB — T4, FREE: FREE T4: 0.9 ng/dL (ref 0.80–1.80)

## 2014-07-28 LAB — HEMOGLOBIN A1C
HEMOGLOBIN A1C: 5.2 % (ref 4.8–5.6)
Mean Plasma Glucose: 103 mg/dL

## 2014-07-28 LAB — PHOSPHORUS

## 2014-07-28 LAB — MAGNESIUM: MAGNESIUM: 1.6 mg/dL (ref 1.5–2.5)

## 2014-07-28 LAB — PROTIME-INR
INR: 1.17 (ref 0.00–1.49)
PROTHROMBIN TIME: 15.1 s (ref 11.6–15.2)

## 2014-07-28 LAB — GLUCOSE, CAPILLARY: GLUCOSE-CAPILLARY: 82 mg/dL (ref 70–99)

## 2014-07-28 LAB — LIPASE, BLOOD: Lipase: 854 U/L — ABNORMAL HIGH (ref 11–59)

## 2014-07-28 MED ORDER — NYSTATIN 100000 UNIT/GM EX POWD
Freq: Three times a day (TID) | CUTANEOUS | Status: DC
  Administered 2014-07-28 – 2014-08-01 (×13): via TOPICAL
  Filled 2014-07-28: qty 15

## 2014-07-28 MED ORDER — SODIUM PHOSPHATE 3 MMOLE/ML IV SOLN
30.0000 mmol | Freq: Once | INTRAVENOUS | Status: AC
  Administered 2014-07-28: 30 mmol via INTRAVENOUS
  Filled 2014-07-28: qty 10

## 2014-07-28 MED ORDER — POTASSIUM CHLORIDE 10 MEQ/100ML IV SOLN
10.0000 meq | INTRAVENOUS | Status: AC
  Administered 2014-07-28 (×4): 10 meq via INTRAVENOUS
  Filled 2014-07-28 (×4): qty 100

## 2014-07-28 MED ORDER — MAGNESIUM SULFATE 2 GM/50ML IV SOLN
2.0000 g | Freq: Once | INTRAVENOUS | Status: AC
  Administered 2014-07-28: 2 g via INTRAVENOUS
  Filled 2014-07-28: qty 50

## 2014-07-28 MED ORDER — POTASSIUM CHLORIDE CRYS ER 20 MEQ PO TBCR
40.0000 meq | EXTENDED_RELEASE_TABLET | ORAL | Status: AC
  Administered 2014-07-28 (×2): 40 meq via ORAL
  Filled 2014-07-28 (×2): qty 2

## 2014-07-28 MED ORDER — SODIUM CHLORIDE 0.9 % IV SOLN: INTRAVENOUS | Status: DC

## 2014-07-28 NOTE — Progress Notes (Signed)
CRITICAL VALUE ALERT  Critical value received:  K+ 2.6 Phosphorus <1.0  Date of notification:  07/28/14  Time of notification:  0834  Critical value read back:Yes.    Nurse who received alert:  Precious Bard, RN  MD notified (1st page):  MD Reece Levy  Time of first page:  [phone]  MD notified (2nd page):  Time of second page:  Responding MD:  MD Reece Levy  Time MD responded:  [phone]

## 2014-07-28 NOTE — Progress Notes (Signed)
[facility] PROGRESS NOTE  Stacy Pena CZY:[id number] DOB: 1966-04-10 DOA: 07/26/2014 PCP: Lujean Amel, MD  Assessment/Plan:  Acute alcoholic pancreatitis/Ileitis with terminal/Abdominal pain - MRCP showed findings consistent with pancreatitis, no evidence for pancreatic necrosis, pseudocyst or abscess, no cholelithiasis or choledocholithiasis. - Continue supportive management. - As her pain is better controlled, start clear liquid diet and advance as tolerated. - Continue ciprofloxacin and metronidazole. - LDL 154, triglycerides 204.  Dehydration - Improved with IV hydration.  Liver lesion - AFP tumor marker pending.  MRCP showed 2.2 cm enhancing lesion in the posterior right liver which is nonspecific, which may represent focal nodular hyperplasia, patient may benefit from outpatient follow-up after resolution of acute symptoms.  Findings were discussed with the patient, patient indicated that she will follow-up with her primary care physician for further evaluation.  HTN (hypertension), benign - Better controlled. - Patient started on ARB/diuretic while patient nothing by mouth. - Discontinue low-dose amlodipine.  Elevated TSH - Free T4 normal.    Fibroids/Elevated serum human chorionic gonadotropin (hCG) level in female, not pregnant - CA-125 pending. - Will need to follow with gynecology at discharge.  Candidiasis under her left breast - Start topical nystatin statin 3 times a day.  Alcohol abuse - On CIWA.  Patient on thiamine and folic acid. - Patient has been counseled on cessation extensively.  Hyponatremia Likely due to alcohol abuse.  Continue to monitor.  Continue hydration on IV fluids.  Hypokalemia Potassium 2.6.  Replace aggressively.  Hypomagnesemia Replace as needed.  Hypophosphatemia Replace as needed.  Likely due to chronic alcohol use and poor oral intake.  Code Status: Full code. Family Communication: None at bedside Disposition Plan:  Continue as inpatient.    Consultants:  None  Procedures:  MRCP  CT abdomen and pelvis with contrast  Pelvic ultrasound  Antibiotics:  Ciprofloxacin and metronidazole 07/27/2014 -->  HPI/Subjective: Patient reports that her abdominal pain is improved, she would like to eat something today.  Objective: Filed Vitals:   07/27/14 1311 07/27/14 1822 07/27/14 2146 07/28/14 0544  BP: 149/76 151/73 140/84 122/66  Pulse: 101 97 91 90  Temp: 97.7 F (36.5 C) 98.1 F (36.7 C) 98.2 F (36.8 C) 97.9 F (36.6 C)  TempSrc: Oral Oral Oral Oral  Resp: 20 17 17 16   Height: 5' 3.6" (1.615 m)     Weight: 71.1 kg (156 lb 12 oz)     SpO2: 100% 100% 100% 100%    Intake/Output Summary (Last 24 hours) at 07/28/14 1009 Last data filed at 07/27/14 1300  Gross per 24 hour  Intake      0 ml  Output      0 ml  Net      0 ml   Filed Weights   07/26/14 1546 07/27/14 1311  Weight: 70.081 kg (154 lb 8 oz) 71.1 kg (156 lb 12 oz)    Exam:  Physical Exam: General: Awake, Oriented, No acute distress. HEENT: EOMI. Neck: Supple CV: S1 and S2 Lungs: Mild distention, soft, no guarding, mild generalized tenderness. Abdomen: Soft, Nontender, Nondistended, +bowel sounds. Ext: Good pulses. Trace edema.  Data Reviewed: Basic Metabolic Panel:  Recent Labs Lab 07/25/14 1913 07/26/14 1553 07/26/14 1604 07/27/14 0247 07/27/14 0900 07/28/14 0550  NA 134* 130* 134* 133*  --  133*  K 2.8* 3.2* 3.4* 3.3*  --  2.6*  CL 97 100 102 107  --  108  CO2 16* 8*  --  12*  --  17*  GLUCOSE 77 97  95 104*  --  88  BUN 5* 6 4* <5*  --  <5*  CREATININE 0.77 1.01 0.50 1.01  --  0.60  CALCIUM 9.1 9.6  --  8.2*  --  8.7  MG  --   --   --   --  1.6 1.6  PHOS  --   --   --   --   --  <1.0*   Liver Function Tests:  Recent Labs Lab 07/25/14 1913 08-01-14 1553 07/27/14 0247 07/28/14 0550  AST 250* 184* 135* 100*  ALT 88* 77* 60* 40*  ALKPHOS 211* 199* 167* 136*  BILITOT 2.1* 2.6* 2.6* 1.7*  PROT  8.5* 8.4* 6.8 6.1  ALBUMIN 4.5 4.3 3.6 3.0*    Recent Labs Lab 07/25/14 1913 08-01-2014 1553 07/28/14 0550  LIPASE 122* 2120* 854*  AMYLASE 96  --   --    No results for input(s): AMMONIA in the last 168 hours. CBC:  Recent Labs Lab 07/25/14 1913 08/01/2014 1553 2014/08/01 1604 07/27/14 0247 07/28/14 0550  WBC 9.1 8.6  --  9.5 6.9  NEUTROABS 7.0 6.7  --   --   --   HGB 12.8 12.9 15.0 12.2 11.1*  HCT 37.2 38.0 44.0 36.2 31.8*  MCV 96.1 98.4  --  97.8 95.5  PLT 280 260  --  234 184   Cardiac Enzymes: No results for input(s): CKTOTAL, CKMB, CKMBINDEX, TROPONINI in the last 168 hours. BNP (last 3 results) No results for input(s): BNP in the last 8760 hours.  ProBNP (last 3 results) No results for input(s): PROBNP in the last 8760 hours.  CBG:  Recent Labs Lab 07/27/14 0903 07/28/14 0744  GLUCAP 88 82    Recent Results (from the past 240 hour(s))  Urine culture     Status: None   Collection Time: 2014/08/01  5:17 PM  Result Value Ref Range Status   Specimen Description URINE, RANDOM  Final   Special Requests NONE  Final   Colony Count NO GROWTH Performed at [facility]   Final   Culture NO GROWTH Performed at [facility]   Final   Report Status 07/28/2014 FINAL  Final     Studies: US Ob Comp Less 14 Wks  2014/08/01   CLINICAL DATA:  Acute onset of generalized abdominal pain and vomiting. Initial encounter.  EXAM: OBSTETRIC <14 WK [place] AND TRANSVAGINAL OB US  TECHNIQUE: Both transabdominal and transvaginal ultrasound examinations were performed for complete evaluation of the gestation as well as the maternal uterus, adnexal regions, and pelvic cul-de-sac. Transvaginal technique was performed to assess early pregnancy.  COMPARISON:  None.  FINDINGS: Intrauterine gestational sac: None seen.  Yolk sac:  N/A  Embryo:  N/A  Maternal uterus/adnexae: Multiple fibroids are noted within the uterus, the largest of which measures 3.9 cm on the left side. A 2.6 cm  fibroid is noted on the right. The fibroids partially obscure the endometrial echo complex. The uterus is otherwise unremarkable.  Only a single ovary is visualized, measuring 2.8 x 1.9 x 2.0 cm, slightly to the right. According to the patient, one of the ovaries has been removed. No suspicious adnexal masses are seen. The visualized ovary is grossly unremarkable.  IMPRESSION: 1. No intrauterine gestational sac seen. No evidence for ectopic pregnancy. This is within normal limits, given the very low quantitative beta HCG level. If the quantitative beta HCG level continues to rise, follow-up pelvic ultrasound could be considered in 2 or more weeks.  2. Multiple uterine fibroids noted. The fibroids partially obscure the endometrial echo complex. 3. Visualized ovary is unremarkable in appearance. No evidence for ovarian torsion.   Electronically Signed   By: Garald Balding M.D.   On: 07/26/2014 22:49   US Ob Transvaginal  07/26/2014   CLINICAL DATA:  Acute onset of generalized abdominal pain and vomiting. Initial encounter.  EXAM: OBSTETRIC <14 WK [place] AND TRANSVAGINAL OB US  TECHNIQUE: Both transabdominal and transvaginal ultrasound examinations were performed for complete evaluation of the gestation as well as the maternal uterus, adnexal regions, and pelvic cul-de-sac. Transvaginal technique was performed to assess early pregnancy.  COMPARISON:  None.  FINDINGS: Intrauterine gestational sac: None seen.  Yolk sac:  N/A  Embryo:  N/A  Maternal uterus/adnexae: Multiple fibroids are noted within the uterus, the largest of which measures 3.9 cm on the left side. A 2.6 cm fibroid is noted on the right. The fibroids partially obscure the endometrial echo complex. The uterus is otherwise unremarkable.  Only a single ovary is visualized, measuring 2.8 x 1.9 x 2.0 cm, slightly to the right. According to the patient, one of the ovaries has been removed. No suspicious adnexal masses are seen. The visualized ovary is grossly  unremarkable.  IMPRESSION: 1. No intrauterine gestational sac seen. No evidence for ectopic pregnancy. This is within normal limits, given the very low quantitative beta HCG level. If the quantitative beta HCG level continues to rise, follow-up pelvic ultrasound could be considered in 2 or more weeks. 2. Multiple uterine fibroids noted. The fibroids partially obscure the endometrial echo complex. 3. Visualized ovary is unremarkable in appearance. No evidence for ovarian torsion.   Electronically Signed   By: Garald Balding M.D.   On: 07/26/2014 22:49   Ct Abdomen Pelvis W Contrast  07/27/2014   CLINICAL DATA:  Abdominal pain. Melena. Hematemesis. Black stools for 1.5 weeks.  EXAM: CT ABDOMEN AND PELVIS WITH CONTRAST  TECHNIQUE: Multidetector CT imaging of the abdomen and pelvis was performed using the standard protocol following bolus administration of intravenous contrast.  CONTRAST:  100 mL Omnipaque 300  COMPARISON:  None.  FINDINGS: Lung bases are clear.  Diffuse fatty infiltration of the liver. Hepatic enlargement. Focal hyperenhancing lesion in the posterior segment right lobe of the liver measuring 2.2 cm diameter. This is most likely to represent focal nodular hyperplasia, but the appearance is nonspecific and adenoma, or other primary or metastatic lesions are not excluded. Suggest elective MRI for further evaluation. There is diffuse infiltration and edema around the pancreas consistent with acute pancreatitis. Normal enhancement of the pancreatic parenchyma. No evidence of pancreatic necrosis. No loculated fluid collection or pseudocyst. No pancreatic ductal dilatation.  Gallbladder, spleen, adrenal glands, kidneys, abdominal aorta, inferior vena cava, and retroperitoneal lymph nodes are unremarkable. Stomach and small bowel are decompressed. Contrast material flows through to the colon without evidence of bowel obstruction. There is suggestion of small bowel wall thickening in the terminal ileum which  could represent inflammatory or reactive process. Colon is decompressed. No free air in the abdomen.  Pelvis: Uterus demonstrates mild nodular prominence suggesting fibroids. No abnormal adnexal masses. Bladder wall is not thickened. No free or loculated pelvic fluid collections. Degenerative changes in the spine. No destructive bone lesions.  IMPRESSION: Infiltration edema around the pancreas consistent with acute pancreatitis. No complication is identified. Probable reactive inflammation of the terminal ileum.  Diffuse fatty infiltration of the liver. Focal hyperenhancing lesion in the posterior segment right lobe of the liver probably  represents focal nodular hyperplasia but other lesions not excluded. Consider follow-up with elective MRI.   Electronically Signed   By: Lucienne Capers M.D.   On: 07/27/2014 01:49   Mr Jeananne Rama W/wo Cm/mrcp  07/27/2014   CLINICAL DATA:  Initial encounter for all worsening abdominal pain with pancreatitis.  EXAM: MRI ABDOMEN WITHOUT AND WITH CONTRAST (INCLUDING MRCP)  TECHNIQUE: Multiplanar multisequence MR imaging of the abdomen was performed both before and after the administration of intravenous contrast. Heavily T2-weighted images of the biliary and pancreatic ducts were obtained, and three-dimensional MRCP images were rendered by post processing.  CONTRAST:  44mL MULTIHANCE GADOBENATE DIMEGLUMINE 529 MG/ML IV SOLN  COMPARISON:  CT scan from earlier the same day.  FINDINGS: Lower chest:  Unremarkable.  Hepatobiliary: Liver it is markedly enlarged, measuring 26.5 cm in craniocaudal length. Diffuse hepatic steatosis is evident. The 2.2 cm enhancing lesion in the posterior right liver seen on the CT earlier today and is again identified. Lesion is nearly isointense to background hepatic parenchyma on T1 and T2 weighted imaging. After IV contrast administration the lesion shows rapid relatively diffuse enhancement, but does not washout to background parenchymal levels on the more  delayed postcontrast sequences. No other enhancing liver lesions are evident.  No evidence for gallstones. There is no intra or extrahepatic biliary duct dilatation. No choledocholithiasis.  Pancreas: Pancreatic parenchyma is diffusely edematous without dilatation of the main pancreatic duct. No evidence for an enhancing lesion within the pancreatic parenchyma. Pancreatic parenchyma on shows fairly homogeneous enhancement throughout with no areas of nonenhancement to suggest necrosis. There is peripancreatic edema and/or inflammation in the retroperitoneal fat. No evidence for pseudocyst at this time. No retroperitoneal abscess on the current study.  Spleen: No splenomegaly. No focal mass lesion.  Adrenals/Urinary Tract: No adrenal nodule or mass. Kidneys are unremarkable.  Stomach/Bowel: Stomach is nondistended. No gastric wall thickening. No evidence of outlet obstruction. Duodenum is normally positioned as is the ligament of Treitz. No small bowel wall thickening. No small bowel dilatation. Abdominal segments of the colon are unremarkable.  Vascular/Lymphatic: No abdominal aortic aneurysm. Normal flow signal is seen in the portal vein, superior mesenteric vein, and splenic vein. The celiac axis and the superior mesenteric artery opacified normally. No evidence for abdominal lymphadenopathy.  Other: Small volume intraperitoneal free fluid is evident.  Musculoskeletal: No abnormal marrow signal or enhancement within the visualized bony anatomy.  IMPRESSION: Imaging features consistent with pancreatitis. No evidence for pancreatic necrosis. No pseudocyst or abscess in the abdomen.  No evidence for cholelithiasis.  No choledocholithiasis.  2.2 cm enhancing lesion in the posterior right liver is nonspecific by MR imaging. This may represent [place] with slightly atypical washout characteristics. Followup is recommended. After resolution of the patient's acute symptoms, MRI evaluation using a hepatocyte specific contrast  agent (Eovist) is recommended.   Electronically Signed   By: Misty Stanley M.D.   On: 07/27/2014 12:07    Scheduled Meds: . amLODipine  5 mg Oral Daily  . ciprofloxacin  400 mg Intravenous Q12H  . citalopram  10 mg Oral Daily  . divalproex  500 mg Oral Daily  . folic acid  1 mg Oral Daily  . losartan  100 mg Oral Daily   And  . hydrochlorothiazide  12.5 mg Oral Daily  . metronidazole  500 mg Intravenous Q8H  . multivitamin with minerals  1 tablet Oral Daily  . nystatin   Topical TID  . pantoprazole  80 mg Oral Daily  . pneumococcal  23 valent vaccine  0.5 mL Intramuscular Tomorrow-1000  . potassium chloride  10 mEq Intravenous Q1 Hr x 4  . potassium chloride  40 mEq Oral Q4H  . sodium chloride  3 mL Intravenous Q12H  . thiamine  100 mg Oral Daily   Or  . thiamine  100 mg Intravenous Daily   Continuous Infusions: . sodium chloride 75 mL/hr at 07/28/14 0531    Active Problems:   HTN (hypertension), benign   Abdominal pain   Acute alcoholic pancreatitis   Dehydration   Acute hypokalemia   Elevated TSH   Fibroids   Liver lesion   Elevated serum human chorionic gonadotropin (hCG) level in female, not pregnant   Ileitis, terminal    Christi Wirick A, MD  [facility] Pager [phone]. If 7PM-7AM, please contact night-coverage at [email or web address], password [facility] 07/28/2014, 10:09 AM  LOS: 2 days

## 2014-07-29 DIAGNOSIS — R1084 Generalized abdominal pain: Secondary | ICD-10-CM

## 2014-07-29 LAB — COMPREHENSIVE METABOLIC PANEL
ALT: 35 U/L (ref 0–35)
AST: 80 U/L — AB (ref 0–37)
Albumin: 2.8 g/dL — ABNORMAL LOW (ref 3.5–5.2)
Alkaline Phosphatase: 137 U/L — ABNORMAL HIGH (ref 39–117)
Anion gap: 7 (ref 5–15)
BILIRUBIN TOTAL: 1.2 mg/dL (ref 0.3–1.2)
BUN: <5 mg/dL — ABNORMAL LOW (ref 6–23)
CALCIUM: 8.5 mg/dL (ref 8.4–10.5)
CO2: 20 mmol/L (ref 19–32)
Chloride: 109 mmol/L (ref 96–112)
Creatinine, Ser: 0.53 mg/dL (ref 0.50–1.10)
GFR calc Af Amer: >90 mL/min (ref >90)
GFR calc non Af Amer: >90 mL/min (ref >90)
Glucose, Bld: 107 mg/dL — ABNORMAL HIGH (ref 70–99)
Potassium: 2.6 mmol/L — CL (ref 3.5–5.1)
SODIUM: 136 mmol/L (ref 135–145)
Total Protein: 5.6 g/dL — ABNORMAL LOW (ref 6.0–8.3)

## 2014-07-29 LAB — CBC
HEMATOCRIT: 30.2 % — AB (ref 36.0–46.0)
Hemoglobin: 10.2 g/dL — ABNORMAL LOW (ref 12.0–15.0)
MCH: 32.6 pg (ref 26.0–34.0)
MCHC: 33.8 g/dL (ref 30.0–36.0)
MCV: 96.5 fL (ref 78.0–100.0)
Platelets: 189 10*3/uL (ref 150–400)
RBC: 3.13 MIL/uL — ABNORMAL LOW (ref 3.87–5.11)
RDW: 13.3 % (ref 11.5–15.5)
WBC: 5.6 10*3/uL (ref 4.0–10.5)

## 2014-07-29 LAB — GLUCOSE, CAPILLARY: Glucose-Capillary: 99 mg/dL (ref 70–99)

## 2014-07-29 LAB — MAGNESIUM: Magnesium: 1.9 mg/dL (ref 1.5–2.5)

## 2014-07-29 LAB — LIPASE, BLOOD: LIPASE: 327 U/L — AB (ref 11–59)

## 2014-07-29 LAB — AFP TUMOR MARKER: AFP-Tumor Marker: 5.8 ng/mL (ref 0.0–8.3)

## 2014-07-29 LAB — CA 125: CA 125: 124.6 U/mL — ABNORMAL HIGH (ref 0.0–34.0)

## 2014-07-29 LAB — PHOSPHORUS: Phosphorus: 1.9 mg/dL — ABNORMAL LOW (ref 2.3–4.6)

## 2014-07-29 MED ORDER — SODIUM CHLORIDE 0.9 % IV SOLN
INTRAVENOUS | Status: DC
  Administered 2014-07-29 – 2014-07-31 (×3): via INTRAVENOUS

## 2014-07-29 MED ORDER — SODIUM PHOSPHATE 3 MMOLE/ML IV SOLN
30.0000 mmol | Freq: Once | INTRAVENOUS | Status: AC
  Administered 2014-07-29: 30 mmol via INTRAVENOUS
  Filled 2014-07-29 (×2): qty 10

## 2014-07-29 MED ORDER — POTASSIUM CHLORIDE CRYS ER 20 MEQ PO TBCR
40.0000 meq | EXTENDED_RELEASE_TABLET | Freq: Three times a day (TID) | ORAL | Status: AC
  Administered 2014-07-29 (×3): 40 meq via ORAL
  Filled 2014-07-29 (×3): qty 2

## 2014-07-29 MED ORDER — HYDROMORPHONE HCL 1 MG/ML IJ SOLN
1.0000 mg | INTRAMUSCULAR | Status: DC | PRN
  Administered 2014-07-29 – 2014-08-01 (×17): 1 mg via INTRAVENOUS
  Filled 2014-07-29 (×17): qty 1

## 2014-07-29 MED ORDER — PANTOPRAZOLE SODIUM 40 MG PO TBEC
40.0000 mg | DELAYED_RELEASE_TABLET | Freq: Two times a day (BID) | ORAL | Status: DC
  Administered 2014-07-29 – 2014-08-01 (×6): 40 mg via ORAL
  Filled 2014-07-29 (×5): qty 1

## 2014-07-29 NOTE — Progress Notes (Addendum)
Patient ID: Stacy Pena, female   DOB: [birth year], 49 y.o.   MRN: [id number]  [facility] PROGRESS NOTE  Elijah Michaelis GYJ:[id number] DOB: [birth year] DOA: 07/26/2014 PCP: Lujean Amel, MD   Brief narrative:    49 y.o. female presented with one week duration of persistent generalized abdominal pain, constant and sharp in nature, 10/10 in severity, non radiating, associated with nausea, vomiting and some blood in it, poor oral intake. Pt denied similar events in the past. She endorses drinking about one liter of liquor daily and last drink was in the morning prior to the admission.   In ED, pt was hemodynamically stable but in pain, lipase was elevated > 2000, urine hCG positive. [facility] asked to admit for further evaluation of what appeared to be acute pancreatitis secondary to alcohol abuse.    Assessment/Plan:    Acute abdominal pain secondary to acute alcoholic pancreatitis with reactive inflammation of the terminal ileum  - MRCP c/w pancreatitis with no evidence for pancreatic necrosis, pseudocyst or abscess, no cholelithiasis or choledocholithiasis - pt also started on ABX Cipro and flagyl, day #3/7 - pt is still on clear liquids but reports persistent pain requiring IV analgesia - lipase is trending down but still elevated: 2100 --> 850 --> 320 this AM - will continue clear liquids and will not advance diet until lipase more stable - pt made aware that as long as she is requiring IV analgesia, will not be able to advance diet - if pt wants to have diet advanced, try switching to PO analgesia and if pain well controlled, may advance diet  - continue IVF and supplement electrolytes, K and phosphate  - LDL 154, triglycerides 204.  Acute alcoholic hepatitis - LFT's trending down with supportive care - continue to monitor trend  - provide thiamine, folate, MVI - keep on CIWA protocol  Acute on chronic alcohol abuse - discussed cessation in detail with pt - she has verbalized  understanding, will to receive outpatient counseling   Hematemesis - likely Mallory -Weiss tear from vomiting in the setting of acute pancreatitis and hepatitis  - now resolved since 2/11 - continue Protonix BID PO   Liver lesion - AFP tumor marker pending - MRCP with 2.2 cm enhancing lesion in posterior right liver, nonspecific, ? 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 - Patient started on ARB/diuretic   Elevated TSH - Free T4 normal.  - recommend repeating in 4-6 weeks  Fibroids/Elevated serum human chorionic gonadotropin (hCG) level, not pregnant - CA-125 pending. - Will need to follow with gynecology at discharge.  Candidiasis under her left breast - Started topical nystatin statin 3 times a day 2/11  Moderate PCM - in the setting of heavy alcohol use and resulting poor oral intake - continue MVI, clear liquid diet   DVT prophylaxis - SCD's  Code Status: Full.  Family Communication:  plan of care discussed with the patient Disposition Plan: Home when  lipase stabilizes and pt able to tolerate diet better. Still requiring IV analgesia and on clear liquids.   IV access:  Peripheral IV  Procedures and diagnostic studies:     US Ob Transvag 07/26/2014    No intrauterine gestational sac seen. No evidence for ectopic pregnancy. This is within normal limits, given the very low quantitative beta  HCG level. If the quantitative beta HCG level continues to rise, follow-up pelvic ultrasound could be considered in 2 or more weeks. Multiple uterine fibroids noted. The fibroids partially obscure the endometrial echo complex. Visualized ovary is unremarkable in appearance. No evidence for ovarian torsion.  CT Abd/Pelvis W/C  07/27/2014    Infiltration edema around the pancreas consistent with acute pancreatitis. No complication is identified. Probable reactive inflammation of the terminal ileum.  Diffuse fatty infiltration of the liver. Focal hyperenhancing lesion in the posterior segment right lobe of the liver probably represents focal nodular hyperplasia but other lesions not excluded.   MRCP 07/27/2014    Imaging features consistent with pancreatitis. No evidence for pancreatic necrosis. No pseudocyst or abscess in the abdomen.  No evidence for cholelithiasis.  No choledocholithiasis.  2.2 cm enhancing lesion in the posterior right liver is nonspecific by MR imaging. This may represent [place] with slightly atypical washout characteristics. Followup is recommended. After resolution of the patient's acute symptoms, MRI evaluation using a hepatocyte specific contrast agent (Eovist) is recommended.    Medical Consultants:  None   Other Consultants:  None   IAnti-Infectives:   Cipro 2/10 --> Flagyl 2/10 -->  Faye Ramsay, MD  [facility] Pager [phone]  If 7PM-7AM, please contact night-coverage [email or web address] Password [email or web address] 07/29/2014, 11:26 AM   LOS: 3 days   HPI/Subjective: No events overnight.   Objective: Filed Vitals:   07/28/14 0544 07/28/14 1343 07/28/14 2151 07/29/14 0544  BP: 122/66 103/55 107/64 113/67  Pulse: 90 91 100 87  Temp: 97.9 F (36.6 C) 98.2 F (36.8 C) 97.7 F (36.5 C) 98.6 F (37 C)  TempSrc: Oral Oral Oral Oral  Resp: 16 17 16 16   Height:      Weight:      SpO2: 100% 100% 100% 100%    Intake/Output Summary (Last 24 hours) at 07/29/14 1126 Last data  filed at 07/29/14 0857  Gross per 24 hour  Intake   3045 ml  Output      0 ml  Net   3045 ml    Exam:   General:  Pt is alert, follows commands appropriately, not in acute distress  Cardiovascular: Regular rate and rhythm, S1/S2, no murmurs, no rubs, no gallops  Respiratory: Clear to auscultation bilaterally, no wheezing, no crackles, no rhonchi  Abdomen: Soft, non tender, slightly distended, bowel sounds present, no guarding  Extremities: No edema, pulses DP and PT palpable bilaterally  Neuro: Grossly nonfocal  Data Reviewed: Basic Metabolic Panel:  Recent Labs Lab 07/25/14 1913 07/26/14 1553 07/26/14 1604 07/27/14 0247 07/27/14 0900 07/28/14 0550 07/29/14 0716  NA 134* 130* 134* 133*  --  133* 136  K 2.8* 3.2* 3.4* 3.3*  --  2.6* 2.6*  CL 97 100 102 107  --  108 109  CO2 16* 8*  --  12*  --  17* 20  GLUCOSE 77 97 95 104*  --  88 107*  BUN 5* 6 4* <5*  --  <5* <5*  CREATININE 0.77 1.01 0.50 1.01  --  0.60 0.53  CALCIUM 9.1 9.6  --  8.2*  --  8.7  8.5  MG  --   --   --   --  1.6 1.6 1.9  PHOS  --   --   --   --   --  <1.0* 1.9*   Liver Function Tests:  Recent Labs Lab 07/25/14 1913 07/26/14 1553 07/27/14 0247 07/28/14 0550 07/29/14 0716  AST 250* 184* 135* 100* 80*  ALT 88* 77* 60* 40* 35  ALKPHOS 211* 199* 167* 136* 137*  BILITOT 2.1* 2.6* 2.6* 1.7* 1.2  PROT 8.5* 8.4* 6.8 6.1 5.6*  ALBUMIN 4.5 4.3 3.6 3.0* 2.8*    Recent Labs Lab 07/25/14 1913 07/26/14 1553 07/28/14 0550 07/29/14 0716  LIPASE 122* 2120* 854* 327*  AMYLASE 96  --   --   --    CBC:  Recent Labs Lab 07/25/14 1913 07/26/14 1553 07/26/14 1604 07/27/14 0247 07/28/14 0550 07/29/14 0716  WBC 9.1 8.6  --  9.5 6.9 5.6  NEUTROABS 7.0 6.7  --   --   --   --   HGB 12.8 12.9 15.0 12.2 11.1* 10.2*  HCT 37.2 38.0 44.0 36.2 31.8* 30.2*  MCV 96.1 98.4  --  97.8 95.5 96.5  PLT 280 260  --  234 184 189   CBG:  Recent Labs Lab 07/27/14 0903 07/28/14 0744 07/29/14 0739  GLUCAP  88 82 99    Recent Results (from the past 240 hour(s))  Urine culture     Status: None   Collection Time: 07/26/14  5:17 PM  Result Value Ref Range Status   Specimen Description URINE, RANDOM  Final   Special Requests NONE  Final   Colony Count NO GROWTH Performed at [facility]   Final   Culture NO GROWTH Performed at [facility]   Final   Report Status 07/28/2014 FINAL  Final     Scheduled Meds: . ciprofloxacin  400 mg Intravenous Q12H  . citalopram  10 mg Oral Daily  . divalproex  500 mg Oral Daily  . folic acid  1 mg Oral Daily  . losartan  100 mg Oral Daily   And  . hydrochlorothiazide  12.5 mg Oral Daily  . metronidazole  500 mg Intravenous Q8H  . nystatin   Topical TID  . pantoprazole  80 mg Oral Daily  . thiamine  100 mg Oral Daily   Continuous Infusions:

## 2014-07-30 LAB — CBC
HCT: 29.6 % — ABNORMAL LOW (ref 36.0–46.0)
HEMOGLOBIN: 9.9 g/dL — AB (ref 12.0–15.0)
MCH: 32.8 pg (ref 26.0–34.0)
MCHC: 33.4 g/dL (ref 30.0–36.0)
MCV: 98 fL (ref 78.0–100.0)
PLATELETS: 237 10*3/uL (ref 150–400)
RBC: 3.02 MIL/uL — AB (ref 3.87–5.11)
RDW: 13.9 % (ref 11.5–15.5)
WBC: 6 10*3/uL (ref 4.0–10.5)

## 2014-07-30 LAB — COMPREHENSIVE METABOLIC PANEL
ALK PHOS: 142 U/L — AB (ref 39–117)
ALT: 32 U/L (ref 0–35)
AST: 66 U/L — ABNORMAL HIGH (ref 0–37)
Albumin: 2.6 g/dL — ABNORMAL LOW (ref 3.5–5.2)
Anion gap: 6 (ref 5–15)
BILIRUBIN TOTAL: 1 mg/dL (ref 0.3–1.2)
BUN: <5 mg/dL — ABNORMAL LOW (ref 6–23)
CHLORIDE: 107 mmol/L (ref 96–112)
CO2: 24 mmol/L (ref 19–32)
CREATININE: 0.51 mg/dL (ref 0.50–1.10)
Calcium: 8.5 mg/dL (ref 8.4–10.5)
GFR calc non Af Amer: >90 mL/min (ref >90)
Glucose, Bld: 113 mg/dL — ABNORMAL HIGH (ref 70–99)
Potassium: 3.2 mmol/L — ABNORMAL LOW (ref 3.5–5.1)
Sodium: 137 mmol/L (ref 135–145)
Total Protein: 5.5 g/dL — ABNORMAL LOW (ref 6.0–8.3)

## 2014-07-30 LAB — GLUCOSE, CAPILLARY: Glucose-Capillary: 101 mg/dL — ABNORMAL HIGH (ref 70–99)

## 2014-07-30 LAB — LIPASE, BLOOD: LIPASE: 299 U/L — AB (ref 11–59)

## 2014-07-30 LAB — OCCULT BLOOD X 1 CARD TO LAB, STOOL: Fecal Occult Bld: POSITIVE — AB

## 2014-07-30 LAB — PHOSPHORUS: PHOSPHORUS: 3.3 mg/dL (ref 2.3–4.6)

## 2014-07-30 MED ORDER — POTASSIUM CHLORIDE CRYS ER 20 MEQ PO TBCR
40.0000 meq | EXTENDED_RELEASE_TABLET | Freq: Once | ORAL | Status: AC
  Administered 2014-07-30: 40 meq via ORAL
  Filled 2014-07-30: qty 2

## 2014-07-30 MED ORDER — NICOTINE 21 MG/24HR TD PT24
21.0000 mg | MEDICATED_PATCH | Freq: Every day | TRANSDERMAL | Status: DC
  Administered 2014-07-30 – 2014-08-01 (×3): 21 mg via TRANSDERMAL
  Filled 2014-07-30 (×3): qty 1

## 2014-07-30 NOTE — Progress Notes (Signed)
Patient ID: Stacy Pena, female   DOB: 1965-09-12, 49 y.o.   MRN: [id number]  [facility] PROGRESS NOTE  Stacy Pena OMV:[id number] DOB: 1965/12/24 DOA: 07/26/2014 PCP: Stacy Amel, MD   Brief narrative:    49 y.o. female presented with one week duration of persistent generalized abdominal pain, constant and sharp in nature, 10/10 in severity, non radiating, associated with nausea, vomiting and some blood in it, poor oral intake. Pt denied similar events in the past. She endorses drinking about one liter of liquor daily and last drink was in the morning prior to the admission.   In ED, pt was hemodynamically stable but in pain, lipase was elevated > 2000, urine hCG positive. [facility] asked to admit for further evaluation of what appeared to be acute pancreatitis secondary to alcohol abuse.   Assessment/Plan:    Acute abdominal pain secondary to acute alcoholic pancreatitis with reactive inflammation of the terminal ileum  - MRCP c/w pancreatitis with no evidence for pancreatic necrosis, pseudocyst or abscess, no cholelithiasis or choledocholithiasis - pt also started on ABX Cipro and flagyl, day #4/7 - pt is still on clear liquids but reports persistent pain requiring IV analgesia - lipase is trending down but still elevated: 2100 --> 850 --> 327 -->  299 this AM - will continue clear liquids and will not advance diet until lipase more stable - pt made aware again that as long as she is requiring IV analgesia, will not be able to advance diet - if pt wants to have diet advanced, try switching to PO analgesia and if pain well controlled, may advance diet  - continue IVF and supplement electrolytes, specifically potassium as it is still on low side  - LDL 154, triglycerides 204.  Acute alcoholic hepatitis - LFT's trending down with supportive care - continue to monitor trend  - provide thiamine, folate, MVI - keep on CIWA protocol  Acute on chronic alcohol abuse - discussed  cessation in detail with pt - she has verbalized understanding, willing to receive outpatient counseling   Hematemesis - likely Mallory -Weiss tear from vomiting in the setting of acute pancreatitis and hepatitis  - now resolved since 2/11, tolerating clear liquid diet well  - continue Protonix BID PO   Liver lesion - AFP tumor marker pending - MRCP with 2.2 cm enhancing lesion in posterior right liver, nonspecific, ? 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 - Patient started on ARB/diuretic   Elevated TSH - Free T4 normal.  - recommend repeating in 4-6 weeks  Fibroids/Elevated serum human chorionic gonadotropin (hCG) level, not pregnant - CA-125 pending. - Will need to follow with gynecology at discharge.  Candidiasis under her left breast - Started topical nystatin statin 3 times a day 2/11  Moderate PCM - in the setting of heavy alcohol use and resulting poor oral intake - continue MVI, clear liquid diet  DVT prophylaxis - SCD's  Code Status: Full.  Family Communication: plan of care discussed with the patient Disposition Plan: Home when lipase stabilizes and pt able to tolerate diet better. Still requiring IV analgesia and on clear liquids.   IV access:  Peripheral IV  Procedures and diagnostic studies:     US Ob Transvag 07/26/2014  No  intrauterine gestational sac seen. No evidence for ectopic pregnancy. This is within normal limits, given the very low quantitative beta HCG level. If the quantitative beta HCG level continues to rise, follow-up pelvic ultrasound could be considered in 2 or more weeks. Multiple uterine fibroids noted. The fibroids partially obscure the endometrial echo complex. Visualized ovary is unremarkable in appearance. No evidence for ovarian torsion.  CT Abd/Pelvis W/C 07/27/2014  Infiltration edema around the pancreas consistent with acute pancreatitis. No complication is identified. Probable reactive inflammation of the terminal ileum. Diffuse fatty infiltration of the liver. Focal hyperenhancing lesion in the posterior segment right lobe of the liver probably represents focal nodular hyperplasia but other lesions not excluded.   MRCP 07/27/2014  Imaging features consistent with pancreatitis. No evidence for pancreatic necrosis. No pseudocyst or abscess in the abdomen. No evidence for cholelithiasis. No choledocholithiasis. 2.2 cm enhancing lesion in the posterior right liver is nonspecific by MR imaging. This may represent [place] with slightly atypical washout characteristics. Followup is recommended. After resolution of the patient's acute symptoms, MRI evaluation using a hepatocyte specific contrast agent (Eovist) is recommended.   Medical Consultants:  None    Other Consultants:  None   IAnti-Infectives:   Cipro 2/10 --> Flagyl 2/10 -->  Faye Ramsay, MD  [facility] Pager [phone]  If 7PM-7AM, please contact night-coverage [email or web address] Password [facility] 07/30/2014, 1:51 PM   LOS: 4 days   HPI/Subjective: No events overnight. Pt reports feeling better but still with mild abd discomfort, wants to continue clear liquid diet for now.  Objective: Filed Vitals:   07/29/14 1343 07/29/14 2204 07/30/14 0628 07/30/14 1200  BP: 117/62 103/61 113/67 114/62  Pulse: 102 88 94 86  Temp: 98.4 F  (36.9 C) 98.2 F (36.8 C) 98.6 F (37 C)   TempSrc: Oral Oral Oral   Resp:  16 16   Height:      Weight:      SpO2: 98% 100% 99%     Intake/Output Summary (Last 24 hours) at 07/30/14 1351 Last data filed at 07/30/14 1200  Gross per 24 hour  Intake   1110 ml  Output      0 ml  Net   1110 ml    Exam:   General:  Pt is alert, follows commands appropriately, not in acute distress  Cardiovascular: Regular rate and rhythm,  no rubs, no gallops  Respiratory: Clear to auscultation bilaterally, no wheezing, no crackles, no rhonchi  Abdomen: Soft, tender in LUQ area, non distended, bowel sounds present, no guarding  Extremities: No edema, pulses DP and PT palpable bilaterally  Neuro: Grossly nonfocal  Data Reviewed: Basic Metabolic Panel:  Recent Labs Lab 07/26/14 1553 07/26/14 1604 07/27/14 0247 07/27/14 0900 07/28/14 0550 07/29/14 0716 07/30/14 0421  NA 130* 134* 133*  --  133* 136 137  K 3.2* 3.4* 3.3*  --  2.6* 2.6* 3.2*  CL 100 102 107  --  108 109 107  CO2 8*  --  12*  --  17* 20 24  GLUCOSE 97 95 104*  --  88 107* 113*  BUN 6 4* <5*  --  <5* <  5* <5*  CREATININE 1.01 0.50 1.01  --  0.60 0.53 0.51  CALCIUM 9.6  --  8.2*  --  8.7 8.5 8.5  MG  --   --   --  1.6 1.6 1.9  --   PHOS  --   --   --   --  <1.0* 1.9* 3.3   Liver Function Tests:  Recent Labs Lab 07/26/14 1553 07/27/14 0247 07/28/14 0550 07/29/14 0716 07/30/14 0421  AST 184* 135* 100* 80* 66*  ALT 77* 60* 40* 35 32  ALKPHOS 199* 167* 136* 137* 142*  BILITOT 2.6* 2.6* 1.7* 1.2 1.0  PROT 8.4* 6.8 6.1 5.6* 5.5*  ALBUMIN 4.3 3.6 3.0* 2.8* 2.6*    Recent Labs Lab 07/25/14 1913 07/26/14 1553 07/28/14 0550 07/29/14 0716 07/30/14 0421  LIPASE 122* 2120* 854* 327* 299*  AMYLASE 96  --   --   --   --     CBC:  Recent Labs Lab 07/25/14 1913 07/26/14 1553 07/26/14 1604 07/27/14 0247 07/28/14 0550 07/29/14 0716 07/30/14 0421  WBC 9.1 8.6  --  9.5 6.9 5.6 6.0  NEUTROABS 7.0 6.7  --    --   --   --   --   HGB 12.8 12.9 15.0 12.2 11.1* 10.2* 9.9*  HCT 37.2 38.0 44.0 36.2 31.8* 30.2* 29.6*  MCV 96.1 98.4  --  97.8 95.5 96.5 98.0  PLT 280 260  --  234 184 189 237   CCBG:  Recent Labs Lab 07/27/14 0903 07/28/14 0744 07/29/14 0739 07/30/14 0744  GLUCAP 88 82 99 101*    Recent Results (from the past 240 hour(s))  Urine culture     Status: None   Collection Time: 07/26/14  5:17 PM  Result Value Ref Range Status   Specimen Description URINE, RANDOM  Final   Special Requests NONE  Final   Colony Count NO GROWTH Performed at [facility]   Final   Culture NO GROWTH Performed at [facility]   Final   Report Status 07/28/2014 FINAL  Final     Scheduled Meds: . ciprofloxacin  400 mg Intravenous Q12H  . citalopram  10 mg Oral Daily  . divalproex  500 mg Oral Daily  . folic acid  1 mg Oral Daily  . losartan  100 mg Oral Daily   And  . hydrochlorothiazide  12.5 mg Oral Daily  . metronidazole  500 mg Intravenous Q8H  . multivitamin with minerals  1 tablet Oral Daily  . nicotine  21 mg Transdermal Daily  . nystatin   Topical TID  . pantoprazole  40 mg Oral BID  . sodium chloride  3 mL Intravenous Q12H  . thiamine  100 mg Oral Daily   Or  . thiamine  100 mg Intravenous Daily   Continuous Infusions: . sodium chloride 100 mL/hr at 07/29/14 1609

## 2014-07-31 DIAGNOSIS — K5000 Crohn's disease of small intestine without complications: Secondary | ICD-10-CM

## 2014-07-31 LAB — COMPREHENSIVE METABOLIC PANEL
ALBUMIN: 2.6 g/dL — AB (ref 3.5–5.2)
ALT: 26 U/L (ref 0–35)
ANION GAP: 6 (ref 5–15)
AST: 50 U/L — AB (ref 0–37)
Alkaline Phosphatase: 145 U/L — ABNORMAL HIGH (ref 39–117)
BILIRUBIN TOTAL: 0.9 mg/dL (ref 0.3–1.2)
BUN: <5 mg/dL — ABNORMAL LOW (ref 6–23)
CHLORIDE: 108 mmol/L (ref 96–112)
CO2: 26 mmol/L (ref 19–32)
Calcium: 9.1 mg/dL (ref 8.4–10.5)
Creatinine, Ser: 0.58 mg/dL (ref 0.50–1.10)
GFR calc non Af Amer: >90 mL/min (ref >90)
Glucose, Bld: 107 mg/dL — ABNORMAL HIGH (ref 70–99)
Potassium: 3.4 mmol/L — ABNORMAL LOW (ref 3.5–5.1)
Sodium: 140 mmol/L (ref 135–145)
Total Protein: 5.5 g/dL — ABNORMAL LOW (ref 6.0–8.3)

## 2014-07-31 LAB — CBC
HEMATOCRIT: 30.2 % — AB (ref 36.0–46.0)
Hemoglobin: 10 g/dL — ABNORMAL LOW (ref 12.0–15.0)
MCH: 31.8 pg (ref 26.0–34.0)
MCHC: 33.1 g/dL (ref 30.0–36.0)
MCV: 96.2 fL (ref 78.0–100.0)
PLATELETS: 297 10*3/uL (ref 150–400)
RBC: 3.14 MIL/uL — ABNORMAL LOW (ref 3.87–5.11)
RDW: 14.2 % (ref 11.5–15.5)
WBC: 6.8 10*3/uL (ref 4.0–10.5)

## 2014-07-31 LAB — GLUCOSE, CAPILLARY: GLUCOSE-CAPILLARY: 110 mg/dL — AB (ref 70–99)

## 2014-07-31 LAB — LIPASE, BLOOD: LIPASE: 270 U/L — AB (ref 11–59)

## 2014-07-31 MED ORDER — POTASSIUM CHLORIDE 10 MEQ/100ML IV SOLN
10.0000 meq | INTRAVENOUS | Status: AC
  Administered 2014-07-31 (×4): 10 meq via INTRAVENOUS
  Filled 2014-07-31 (×4): qty 100

## 2014-07-31 NOTE — Progress Notes (Signed)
Patient ID: Stacy Pena, female   DOB: December 31, 1965, 49 y.o.   MRN: [id number]  [facility] PROGRESS NOTE  Stacy Pena DUK:[id number] DOB: January 06, 1966 DOA: 07/26/2014 PCP: Lujean Amel, MD   Brief narrative:    49 y.o. female presented with one week duration of persistent generalized abdominal pain, constant and sharp in nature, 10/10 in severity, non radiating, associated with nausea, vomiting and some blood in it, poor oral intake. Pt denied similar events in the past. She endorses drinking about one liter of liquor daily and last drink was in the morning prior to the admission.   In ED, pt was hemodynamically stable but in pain, lipase was elevated > 2000, urine hCG positive. TR[facility] asked to admit for further evaluation of what appeared to be acute pancreatitis secondary to alcohol abuse.   Assessment/Plan:    Acute abdominal pain secondary to acute alcoholic pancreatitis with reactive inflammation of the terminal ileum  Does not want to advance diet. Saline lock. Continue clears. Ileal inflammation likely from pancreatitis. Doubt infection. D/c abx  Acute alcoholic hepatitis - LFT's improving  Acute on chronic alcohol abuse No DTs. Pt wishes to quit  Hematemesis - likely Mallory -Weiss tear from vomiting in the setting of acute pancreatitis and hepatitis  - now resolved since 2/11, tolerating clear liquid diet well  - continue Protonix BID PO   Liver lesion - AFP normal - MRCP with 2.2 cm enhancing lesion in posterior right liver, nonspecific, ? focal nodular hyperplasia Repeat MRI with contrast as outpatient  Anemia  Secondary to GI blood loss, EtOH, hydration stable  Electrolytes abnormalities corrected  HTN (hypertension), benign controlled  Elevated TSH - Free T4 normal.  - recommend repeating in 4-6 weeks  Fibroids/positive urine (hCG) level Serum hcg normal, [place] without yolk sac. Patient has not been sexually active. CA125 elevated - Will need to follow  with gynecology at discharge.  intertrigo - Started topical nystatin statin 3 times a day 2/11  Moderate PCM - in the setting of heavy alcohol use and resulting poor oral intake - continue MVI, clear liquid diet   DVT prophylaxis - SCD's  Code Status: Full.  Family Communication: plan of care discussed with the patient Disposition Plan:   Procedures and diagnostic studies:     US Ob Transvag 07/26/2014  No intrauterine gestational sac seen. No evidence for ectopic pregnancy. This is within normal limits, given the very low quantitative beta HCG level. If the quantitative beta HCG level continues to rise, follow-up pelvic ultrasound could be considered in 2 or more weeks. Multiple uterine fibroids noted. The fibroids partially obscure the endometrial echo complex. Visualized ovary is unremarkable in appearance. No evidence for ovarian torsion.  CT Abd/Pelvis W/C 07/27/2014  Infiltration edema around the pancreas consistent with acute pancreatitis. No complication is identified. Probable reactive inflammation of the terminal ileum. Diffuse fatty infiltration of the liver. Focal hyperenhancing lesion in the posterior segment right lobe of the liver probably represents focal nodular hyperplasia but other lesions not excluded.   MRCP 07/27/2014  Imaging features consistent with pancreatitis. No evidence for pancreatic necrosis. No pseudocyst or abscess in the abdomen. No evidence for cholelithiasis. No choledocholithiasis. 2.2 cm enhancing lesion in the posterior right liver is nonspecific by MR imaging. This may represent [place] with slightly atypical washout characteristics. Followup is recommended. After resolution of the patient's acute symptoms, MRI evaluation using a hepatocyte specific contrast agent (Eovist) is recommended.   Medical Consultants:  None    Other Consultants:  None  IAnti-Infectives:   Cipro 2/10 --> Flagyl 2/10 -->  07/31/2014, 2:00 PM   LOS: 5 days    HPI/Subjective: Still with LUQ pain. Does not want to advance diet. Taking clears  Objective: Filed Vitals:   07/30/14 1459 07/30/14 2106 07/31/14 0530 07/31/14 1200  BP: 100/59 117/69 109/62 112/60  Pulse: 86 91 88 84  Temp: 97.8 F (36.6 C) 97.3 F (36.3 C) 98.2 F (36.8 C)   TempSrc: Oral Oral Oral   Resp: 16 18 17    Height:      Weight:      SpO2: 100% 100% 100%     Intake/Output Summary (Last 24 hours) at 07/31/14 1400 Last data filed at 07/31/14 1003  Gross per 24 hour  Intake   1080 ml  Output      0 ml  Net   1080 ml    Exam:   General:  A and o, cooperative. comfortable  Cardiovascular: Regular rate and rhythm,  no rubs, no gallops  Respiratory: Clear to auscultation bilaterally, no wheezing, no crackles, no rhonchi  Abdomen: Soft, tender in LUQ area, non distended, bowel sounds present, no guarding  Extremities: No edema, pulses DP and PT palpable bilaterally   Data Reviewed: Basic Metabolic Panel:  Recent Labs Lab 07/27/14 0247 07/27/14 0900 07/28/14 0550 07/29/14 0716 07/30/14 0421 07/31/14 0629  NA 133*  --  133* 136 137 140  K 3.3*  --  2.6* 2.6* 3.2* 3.4*  CL 107  --  108 109 107 108  CO2 12*  --  17* 20 24 26   GLUCOSE 104*  --  88 107* 113* 107*  BUN <5*  --  <5* <5* <5* <5*  CREATININE 1.01  --  0.60 0.53 0.51 0.58  CALCIUM 8.2*  --  8.7 8.5 8.5 9.1  MG  --  1.6 1.6 1.9  --   --   PHOS  --   --  <1.0* 1.9* 3.3  --    Liver Function Tests:  Recent Labs Lab 07/27/14 0247 07/28/14 0550 07/29/14 0716 07/30/14 0421 07/31/14 0629  AST 135* 100* 80* 66* 50*  ALT 60* 40* 35 32 26  ALKPHOS 167* 136* 137* 142* 145*  BILITOT 2.6* 1.7* 1.2 1.0 0.9  PROT 6.8 6.1 5.6* 5.5* 5.5*  ALBUMIN 3.6 3.0* 2.8* 2.6* 2.6*    Recent Labs Lab 07/25/14 1913 07/26/14 1553 07/28/14 0550 07/29/14 0716 07/30/14 0421 07/31/14 0629  LIPASE 122* 2120* 854* 327* 299* 270*  AMYLASE 96  --   --   --   --   --     CBC:  Recent Labs Lab  07/25/14 1913 07/26/14 1553  07/27/14 0247 07/28/14 0550 07/29/14 0716 07/30/14 0421 07/31/14 0629  WBC 9.1 8.6  --  9.5 6.9 5.6 6.0 6.8  NEUTROABS 7.0 6.7  --   --   --   --   --   --   HGB 12.8 12.9  < > 12.2 11.1* 10.2* 9.9* 10.0*  HCT 37.2 38.0  < > 36.2 31.8* 30.2* 29.6* 30.2*  MCV 96.1 98.4  --  97.8 95.5 96.5 98.0 96.2  PLT 280 260  --  234 184 189 237 297  < > = values in this interval not displayed. CCBG:  Recent Labs Lab 07/27/14 0903 07/28/14 0744 07/29/14 0739 07/30/14 0744 07/31/14 0752  GLUCAP 88 82 99 101* 110*    Recent Results (from the past 240 hour(s))  Urine culture     Status:  None   Collection Time: 07/26/14  5:17 PM  Result Value Ref Range Status   Specimen Description URINE, RANDOM  Final   Special Requests NONE  Final   Colony Count NO GROWTH Performed at [facility]   Final   Culture NO GROWTH Performed at [facility]   Final   Report Status 07/28/2014 FINAL  Final     Scheduled Meds: . ciprofloxacin  400 mg Intravenous Q12H  . citalopram  10 mg Oral Daily  . divalproex  500 mg Oral Daily  . folic acid  1 mg Oral Daily  . losartan  100 mg Oral Daily   And  . hydrochlorothiazide  12.5 mg Oral Daily  . metronidazole  500 mg Intravenous Q8H  . multivitamin with minerals  1 tablet Oral Daily  . nicotine  21 mg Transdermal Daily  . nystatin   Topical TID  . pantoprazole  40 mg Oral BID  . sodium chloride  3 mL Intravenous Q12H  . thiamine  100 mg Oral Daily   Or  . thiamine  100 mg Intravenous Daily   Continuous Infusions: . sodium chloride 75 mL/hr at 07/31/14 1209     Doree Barthel, MD [facility] [email or web address] password [facility]

## 2014-08-01 LAB — GLUCOSE, CAPILLARY: Glucose-Capillary: 103 mg/dL — ABNORMAL HIGH (ref 70–99)

## 2014-08-01 MED ORDER — OXYCODONE HCL 5 MG PO TABS
5.0000 mg | ORAL_TABLET | ORAL | Status: DC | PRN
  Administered 2014-08-01: 10 mg via ORAL
  Filled 2014-08-01: qty 2

## 2014-08-01 MED ORDER — OXYCODONE HCL 5 MG PO TABS: 5.0000 mg | ORAL_TABLET | Freq: Four times a day (QID) | ORAL | Status: DC | PRN

## 2014-08-01 MED ORDER — PROMETHAZINE HCL 12.5 MG PO TABS: 12.5000 mg | ORAL_TABLET | Freq: Four times a day (QID) | ORAL | Status: DC | PRN

## 2014-08-01 NOTE — Clinical Social Work Note (Addendum)
Received social work consult to give resources to patient about alcohol use.  Patient stated she does feel like she has a problem with alcohol, and would like help finding out what is available.  Patient presented with resources for AA meetings, and for outpatient and inpatient substance abuse facilities.  Patient was grateful for resources and she said she will look into it.  CSW to sign off please reconsult if social work needs arise again.  Jones Broom. [place], MSW, [place] 08/01/2014 2:05 PM

## 2014-08-01 NOTE — Discharge Summary (Signed)
Physician Discharge Summary  Stacy Pena JOI:[id number] DOB: Nov 22, 1965 DOA: 07/26/2014  PCP: Lujean Amel, MD  Admit date: 07/26/2014 Discharge date: 08/01/2014  Time spent: greater than 30 minutes  Recommendations for Outpatient Follow-up:  1. F/u gynecology for fibroids, positive urine HCG with normal serum HCG, no pregnancy noted on pelvic US, and elevated CA125 2. Repeat MRI with and without contrast after resolution of pancreatitis 3. Repeat TSH in a few months  Discharge Diagnoses:  Primary problem   Acute alcoholic pancreatitis Active Problems:   HTN (hypertension), benign   Dehydration   Acute hypokalemia   Elevated TSH   Fibroids   Liver lesion Acute alcoholic pancreatitis Hematemesis intertrigo  Discharge Condition: stable  Filed Weights   07/26/14 1546 07/27/14 1311  Weight: 70.081 kg (154 lb 8 oz) 71.1 kg (156 lb 12 oz)    History of present illness/Hospital Course:  49 y.o. female presented with one week duration of persistent generalized abdominal pain, constant and sharp in nature, 10/10 in severity, non radiating, associated with nausea, vomiting and some blood in it, poor oral intake. Pt denied similar events in the past. She endorses drinking about one liter of liquor daily and last drink was in the morning prior to the admission.   In ED, pt was hemodynamically stable but in pain, lipase was elevated > 2000, urine hCG positive.   Acute abdominal pain secondary to acute alcoholic pancreatitis with reactive inflammation of the terminal ileum  Started on bowel rest, pain and nausea medications, IVF.  Diet slowly advanced. By discharge, pain, tenderness and lipase much improved. Imaging without cholelithiasis  Acute alcoholic hepatitis - LFT's elevated on admission. Near normal at discharge.    Acute on chronic alcohol abuse No DTs. Pt wishes to quit. Given thiamine during hospitalization  Hematemesis - likely Mallory -Weiss tear vs gastritis. Treated  with empiric bid PPI.  Resolved without need for transfusion.  Liver lesion - AFP normal - MRCP with 2.2 cm enhancing lesion in posterior right liver, nonspecific, ? focal nodular hyperplasia Repeat MRI with contrast as outpatient  Anemia  Secondary to GI blood loss, EtOH, hydration Stable, no transfusion needed  hypokalemia corrected  HTN (hypertension), benign controlled  Elevated TSH - Free T4 normal.  - recommend repeating in 4-6 weeks  Fibroids/positive urine (hCG) level Serum hcg normal, [place] without yolk sac. Patient has not been sexually active. CA125 elevated - Will need to follow with gynecology at discharge.  intertrigo - Started topical nystatin statin 3 times a day 2/11        Procedures:  none  Consultations:  none  Discharge Exam: Filed Vitals:   08/01/14 0433  BP: 106/63  Pulse: 84  Temp: 98.4 F (36.9 C)  Resp: 18    General: comfortable Cardiovascular: RRR Respiratory: CTA abd S, NT, ND  Discharge Instructions   Discharge Instructions    Activity as tolerated - No restrictions    Complete by:  As directed      Discharge instructions    Complete by:  As directed   Low fat diet for 2 weeks. Avoid alcohol          Current Discharge Medication List    START taking these medications   Details  oxyCODONE (OXY IR/ROXICODONE) 5 MG immediate release tablet Take 1 tablet (5 mg total) by mouth every 6 (six) hours as needed for severe pain. Qty: 30 tablet, Refills: 0    promethazine (PHENERGAN) 12.5 MG tablet Take 1 tablet (12.5 mg total) by  mouth every 6 (six) hours as needed for nausea or vomiting. Qty: 15 tablet, Refills: 0      CONTINUE these medications which have NOT CHANGED   Details  citalopram (CELEXA) 10 MG tablet Take 10 mg by mouth daily.    divalproex (DEPAKOTE ER) 500 MG 24 hr tablet Take 500 mg by mouth daily.    LORazepam (ATIVAN) 0.5 MG tablet Take 0.5 mg by mouth 2 (two) times daily.     losartan-hydrochlorothiazide (HYZAAR) 100-12.5 MG per tablet Take 1 tablet by mouth daily.    omeprazole (PRILOSEC) 40 MG capsule Take 40 mg by mouth daily.       No Known Allergies Follow-up Information    Follow up with Lujean Amel, MD In 1 month.   Specialty:  Family Medicine   Why:  need MRI with and without contrast of liver after resolution of pancreatitis   Contact information:   [address] [place] [address] [phone]       Follow up with Catha Brow., MD In 2 weeks.   Specialty:  Obstetrics and Gynecology   Why:  for annual exam and abnormal labs   Contact information:   [address] [place] [address] [phone]        The results of significant diagnostics from this hospitalization (including imaging, microbiology, ancillary and laboratory) are listed below for reference.    Significant Diagnostic Studies: US Ob Comp Less 14 Wks  07/29/14   CLINICAL DATA:  Acute onset of generalized abdominal pain and vomiting. Initial encounter.  EXAM: OBSTETRIC <14 WK [place] AND TRANSVAGINAL OB US  TECHNIQUE: Both transabdominal and transvaginal ultrasound examinations were performed for complete evaluation of the gestation as well as the maternal uterus, adnexal regions, and pelvic cul-de-sac. Transvaginal technique was performed to assess early pregnancy.  COMPARISON:  None.  FINDINGS: Intrauterine gestational sac: None seen.  Yolk sac:  N/A  Embryo:  N/A  Maternal uterus/adnexae: Multiple fibroids are noted within the uterus, the largest of which measures 3.9 cm on the left side. A 2.6 cm fibroid is noted on the right. The fibroids partially obscure the endometrial echo complex. The uterus is otherwise unremarkable.  Only a single ovary is visualized, measuring 2.8 x 1.9 x 2.0 cm, slightly to the right. According to the patient, one of the ovaries has been removed. No suspicious adnexal masses are seen. The visualized ovary is grossly  unremarkable.  IMPRESSION: 1. No intrauterine gestational sac seen. No evidence for ectopic pregnancy. This is within normal limits, given the very low quantitative beta HCG level. If the quantitative beta HCG level continues to rise, follow-up pelvic ultrasound could be considered in 2 or more weeks. 2. Multiple uterine fibroids noted. The fibroids partially obscure the endometrial echo complex. 3. Visualized ovary is unremarkable in appearance. No evidence for ovarian torsion.   Electronically Signed   By: Garald Balding M.D.   On: 29-Jul-2014 22:49   US Ob Transvaginal  07-29-14   CLINICAL DATA:  Acute onset of generalized abdominal pain and vomiting. Initial encounter.  EXAM: OBSTETRIC <14 WK [place] AND TRANSVAGINAL OB US  TECHNIQUE: Both transabdominal and transvaginal ultrasound examinations were performed for complete evaluation of the gestation as well as the maternal uterus, adnexal regions, and pelvic cul-de-sac. Transvaginal technique was performed to assess early pregnancy.  COMPARISON:  None.  FINDINGS: Intrauterine gestational sac: None seen.  Yolk sac:  N/A  Embryo:  N/A  Maternal uterus/adnexae: Multiple fibroids are  noted within the uterus, the largest of which measures 3.9 cm on the left side. A 2.6 cm fibroid is noted on the right. The fibroids partially obscure the endometrial echo complex. The uterus is otherwise unremarkable.  Only a single ovary is visualized, measuring 2.8 x 1.9 x 2.0 cm, slightly to the right. According to the patient, one of the ovaries has been removed. No suspicious adnexal masses are seen. The visualized ovary is grossly unremarkable.  IMPRESSION: 1. No intrauterine gestational sac seen. No evidence for ectopic pregnancy. This is within normal limits, given the very low quantitative beta HCG level. If the quantitative beta HCG level continues to rise, follow-up pelvic ultrasound could be considered in 2 or more weeks. 2. Multiple uterine fibroids noted. The fibroids  partially obscure the endometrial echo complex. 3. Visualized ovary is unremarkable in appearance. No evidence for ovarian torsion.   Electronically Signed   By: Garald Balding M.D.   On: 07/26/2014 22:49   Ct Abdomen Pelvis W Contrast  07/27/2014   CLINICAL DATA:  Abdominal pain. Melena. Hematemesis. Black stools for 1.5 weeks.  EXAM: CT ABDOMEN AND PELVIS WITH CONTRAST  TECHNIQUE: Multidetector CT imaging of the abdomen and pelvis was performed using the standard protocol following bolus administration of intravenous contrast.  CONTRAST:  100 mL Omnipaque 300  COMPARISON:  None.  FINDINGS: Lung bases are clear.  Diffuse fatty infiltration of the liver. Hepatic enlargement. Focal hyperenhancing lesion in the posterior segment right lobe of the liver measuring 2.2 cm diameter. This is most likely to represent focal nodular hyperplasia, but the appearance is nonspecific and adenoma, or other primary or metastatic lesions are not excluded. Suggest elective MRI for further evaluation. There is diffuse infiltration and edema around the pancreas consistent with acute pancreatitis. Normal enhancement of the pancreatic parenchyma. No evidence of pancreatic necrosis. No loculated fluid collection or pseudocyst. No pancreatic ductal dilatation.  Gallbladder, spleen, adrenal glands, kidneys, abdominal aorta, inferior vena cava, and retroperitoneal lymph nodes are unremarkable. Stomach and small bowel are decompressed. Contrast material flows through to the colon without evidence of bowel obstruction. There is suggestion of small bowel wall thickening in the terminal ileum which could represent inflammatory or reactive process. Colon is decompressed. No free air in the abdomen.  Pelvis: Uterus demonstrates mild nodular prominence suggesting fibroids. No abnormal adnexal masses. Bladder wall is not thickened. No free or loculated pelvic fluid collections. Degenerative changes in the spine. No destructive bone lesions.   IMPRESSION: Infiltration edema around the pancreas consistent with acute pancreatitis. No complication is identified. Probable reactive inflammation of the terminal ileum.  Diffuse fatty infiltration of the liver. Focal hyperenhancing lesion in the posterior segment right lobe of the liver probably represents focal nodular hyperplasia but other lesions not excluded. Consider follow-up with elective MRI.   Electronically Signed   By: Lucienne Capers M.D.   On: 07/27/2014 01:49   Mr Jeananne Rama W/wo Cm/mrcp  07/27/2014   CLINICAL DATA:  Initial encounter for all worsening abdominal pain with pancreatitis.  EXAM: MRI ABDOMEN WITHOUT AND WITH CONTRAST (INCLUDING MRCP)  TECHNIQUE: Multiplanar multisequence MR imaging of the abdomen was performed both before and after the administration of intravenous contrast. Heavily T2-weighted images of the biliary and pancreatic ducts were obtained, and three-dimensional MRCP images were rendered by post processing.  CONTRAST:  39mL MULTIHANCE GADOBENATE DIMEGLUMINE 529 MG/ML IV SOLN  COMPARISON:  CT scan from earlier the same day.  FINDINGS: Lower chest:  Unremarkable.  Hepatobiliary: Liver it is  markedly enlarged, measuring 26.5 cm in craniocaudal length. Diffuse hepatic steatosis is evident. The 2.2 cm enhancing lesion in the posterior right liver seen on the CT earlier today and is again identified. Lesion is nearly isointense to background hepatic parenchyma on T1 and T2 weighted imaging. After IV contrast administration the lesion shows rapid relatively diffuse enhancement, but does not washout to background parenchymal levels on the more delayed postcontrast sequences. No other enhancing liver lesions are evident.  No evidence for gallstones. There is no intra or extrahepatic biliary duct dilatation. No choledocholithiasis.  Pancreas: Pancreatic parenchyma is diffusely edematous without dilatation of the main pancreatic duct. No evidence for an enhancing lesion within the  pancreatic parenchyma. Pancreatic parenchyma on shows fairly homogeneous enhancement throughout with no areas of nonenhancement to suggest necrosis. There is peripancreatic edema and/or inflammation in the retroperitoneal fat. No evidence for pseudocyst at this time. No retroperitoneal abscess on the current study.  Spleen: No splenomegaly. No focal mass lesion.  Adrenals/Urinary Tract: No adrenal nodule or mass. Kidneys are unremarkable.  Stomach/Bowel: Stomach is nondistended. No gastric wall thickening. No evidence of outlet obstruction. Duodenum is normally positioned as is the ligament of Treitz. No small bowel wall thickening. No small bowel dilatation. Abdominal segments of the colon are unremarkable.  Vascular/Lymphatic: No abdominal aortic aneurysm. Normal flow signal is seen in the portal vein, superior mesenteric vein, and splenic vein. The celiac axis and the superior mesenteric artery opacified normally. No evidence for abdominal lymphadenopathy.  Other: Small volume intraperitoneal free fluid is evident.  Musculoskeletal: No abnormal marrow signal or enhancement within the visualized bony anatomy.  IMPRESSION: Imaging features consistent with pancreatitis. No evidence for pancreatic necrosis. No pseudocyst or abscess in the abdomen.  No evidence for cholelithiasis.  No choledocholithiasis.  2.2 cm enhancing lesion in the posterior right liver is nonspecific by MR imaging. This may represent [place] with slightly atypical washout characteristics. Followup is recommended. After resolution of the patient's acute symptoms, MRI evaluation using a hepatocyte specific contrast agent (Eovist) is recommended.   Electronically Signed   By: Misty Stanley M.D.   On: 07/27/2014 12:07    Microbiology: Recent Results (from the past 240 hour(s))  Urine culture     Status: None   Collection Time: 07/26/14  5:17 PM  Result Value Ref Range Status   Specimen Description URINE, RANDOM  Final   Special Requests NONE   Final   Colony Count NO GROWTH Performed at [facility]   Final   Culture NO GROWTH Performed at [facility]   Final   Report Status 07/28/2014 FINAL  Final     Labs: Basic Metabolic Panel:  Recent Labs Lab 07/27/14 0247 07/27/14 0900 07/28/14 0550 07/29/14 0716 07/30/14 0421 07/31/14 0629  NA 133*  --  133* 136 137 140  K 3.3*  --  2.6* 2.6* 3.2* 3.4*  CL 107  --  108 109 107 108  CO2 12*  --  17* 20 24 26   GLUCOSE 104*  --  88 107* 113* 107*  BUN <5*  --  <5* <5* <5* <5*  CREATININE 1.01  --  0.60 0.53 0.51 0.58  CALCIUM 8.2*  --  8.7 8.5 8.5 9.1  MG  --  1.6 1.6 1.9  --   --   PHOS  --   --  <1.0* 1.9* 3.3  --    Liver Function Tests:  Recent Labs Lab 07/27/14 0247 07/28/14 0550 07/29/14 0716 07/30/14 0421 07/31/14  0629  AST 135* 100* 80* 66* 50*  ALT 60* 40* 35 32 26  ALKPHOS 167* 136* 137* 142* 145*  BILITOT 2.6* 1.7* 1.2 1.0 0.9  PROT 6.8 6.1 5.6* 5.5* 5.5*  ALBUMIN 3.6 3.0* 2.8* 2.6* 2.6*    Recent Labs Lab 07/25/14 1913 07/26/14 1553 07/28/14 0550 07/29/14 0716 07/30/14 0421 07/31/14 0629  LIPASE 122* 2120* 854* 327* 299* 270*  AMYLASE 96  --   --   --   --   --    No results for input(s): AMMONIA in the last 168 hours. CBC:  Recent Labs Lab 07/25/14 1913 07/26/14 1553  07/27/14 0247 07/28/14 0550 07/29/14 0716 07/30/14 0421 07/31/14 0629  WBC 9.1 8.6  --  9.5 6.9 5.6 6.0 6.8  NEUTROABS 7.0 6.7  --   --   --   --   --   --   HGB 12.8 12.9  < > 12.2 11.1* 10.2* 9.9* 10.0*  HCT 37.2 38.0  < > 36.2 31.8* 30.2* 29.6* 30.2*  MCV 96.1 98.4  --  97.8 95.5 96.5 98.0 96.2  PLT 280 260  --  234 184 189 237 297  < > = values in this interval not displayed. Cardiac Enzymes: No results for input(s): CKTOTAL, CKMB, CKMBINDEX, TROPONINI in the last 168 hours. BNP: BNP (last 3 results) No results for input(s): BNP in the last 8760 hours.  ProBNP (last 3 results) No results for input(s): PROBNP in the last 8760  hours.  CBG:  Recent Labs Lab 07/28/14 0744 07/29/14 0739 07/30/14 0744 07/31/14 0752 08/01/14 0753  GLUCAP 82 99 101* 110* 103*       Signed:  Devan Danzer L  [facility] 08/01/2014, 11:28 AM

## 2014-08-01 NOTE — Progress Notes (Signed)
To whom it may concern:    Stacy Pena is unable to work 07/26/14 - 08/07/14 due to illness.    Sincerely,    Doree Barthel, MD [facility]

## 2014-08-29 ENCOUNTER — Other Ambulatory Visit: Payer: Self-pay | Admitting: Family Medicine

## 2014-08-29 DIAGNOSIS — K769 Liver disease, unspecified: Secondary | ICD-10-CM

## 2014-10-03 ENCOUNTER — Other Ambulatory Visit: Payer: 59

## 2014-10-09 ENCOUNTER — Ambulatory Visit
Admission: RE | Admit: 2014-10-09 | Discharge: 2014-10-09 | Disposition: A | Discharge status: Home / Self Care | Payer: 59 | Source: Ambulatory Visit | Attending: Family Medicine | Admitting: Family Medicine

## 2014-10-09 DIAGNOSIS — K769 Liver disease, unspecified: Secondary | ICD-10-CM

## 2014-10-09 MED ORDER — GADOXETATE DISODIUM 0.25 MMOL/ML IV SOLN
7.0000 mL | Freq: Once | INTRAVENOUS | Status: AC | PRN
  Administered 2014-10-09: 7 mL via INTRAVENOUS

## 2014-10-31 ENCOUNTER — Other Ambulatory Visit (HOSPITAL_COMMUNITY)
Admission: RE | Admit: 2014-10-31 | Discharge: 2014-10-31 | Disposition: A | Discharge status: Home / Self Care | Payer: 59 | Source: Ambulatory Visit | Attending: Obstetrics & Gynecology | Admitting: Obstetrics & Gynecology

## 2014-10-31 ENCOUNTER — Other Ambulatory Visit: Payer: Self-pay | Admitting: Obstetrics & Gynecology

## 2014-10-31 DIAGNOSIS — Z01411 Encounter for gynecological examination (general) (routine) with abnormal findings: Secondary | ICD-10-CM | POA: Insufficient documentation

## 2014-10-31 DIAGNOSIS — Z1151 Encounter for screening for human papillomavirus (HPV): Secondary | ICD-10-CM | POA: Diagnosis present

## 2014-11-01 LAB — CYTOLOGY - PAP

## 2014-11-24 ENCOUNTER — Emergency Department (HOSPITAL_COMMUNITY): Payer: 59

## 2014-11-24 ENCOUNTER — Emergency Department (HOSPITAL_COMMUNITY)
Admission: EM | Admit: 2014-11-24 | Discharge: 2014-11-24 | Disposition: A | Discharge status: Home / Self Care | Payer: 59 | Attending: Emergency Medicine | Admitting: Emergency Medicine

## 2014-11-24 ENCOUNTER — Encounter (HOSPITAL_COMMUNITY): Payer: Self-pay | Admitting: Emergency Medicine

## 2014-11-24 DIAGNOSIS — F10921 Alcohol use, unspecified with intoxication delirium: Secondary | ICD-10-CM

## 2014-11-24 DIAGNOSIS — K219 Gastro-esophageal reflux disease without esophagitis: Secondary | ICD-10-CM | POA: Diagnosis not present

## 2014-11-24 DIAGNOSIS — Z79899 Other long term (current) drug therapy: Secondary | ICD-10-CM | POA: Diagnosis not present

## 2014-11-24 DIAGNOSIS — F10121 Alcohol abuse with intoxication delirium: Secondary | ICD-10-CM | POA: Diagnosis not present

## 2014-11-24 DIAGNOSIS — I10 Essential (primary) hypertension: Secondary | ICD-10-CM | POA: Insufficient documentation

## 2014-11-24 DIAGNOSIS — F319 Bipolar disorder, unspecified: Secondary | ICD-10-CM | POA: Insufficient documentation

## 2014-11-24 DIAGNOSIS — Z720 Tobacco use: Secondary | ICD-10-CM | POA: Diagnosis not present

## 2014-11-24 DIAGNOSIS — R109 Unspecified abdominal pain: Secondary | ICD-10-CM | POA: Diagnosis present

## 2014-11-24 LAB — URINALYSIS, ROUTINE W REFLEX MICROSCOPIC
BILIRUBIN URINE: NEGATIVE
Glucose, UA: NEGATIVE mg/dL
HGB URINE DIPSTICK: NEGATIVE
KETONES UR: NEGATIVE mg/dL
LEUKOCYTES UA: NEGATIVE
Nitrite: NEGATIVE
PROTEIN: NEGATIVE mg/dL
Specific Gravity, Urine: 1.004 — ABNORMAL LOW (ref 1.005–1.030)
Urobilinogen, UA: 0.2 mg/dL (ref 0.0–1.0)
pH: 5 (ref 5.0–8.0)

## 2014-11-24 LAB — RAPID URINE DRUG SCREEN, HOSP PERFORMED
AMPHETAMINES: NOT DETECTED
BENZODIAZEPINES: NOT DETECTED
Barbiturates: NOT DETECTED
Cocaine: NOT DETECTED
Opiates: NOT DETECTED
Tetrahydrocannabinol: NOT DETECTED

## 2014-11-24 LAB — COMPREHENSIVE METABOLIC PANEL
ALBUMIN: 4.4 g/dL (ref 3.5–5.0)
ALT: 158 U/L — ABNORMAL HIGH (ref 14–54)
ANION GAP: 14 (ref 5–15)
AST: 186 U/L — ABNORMAL HIGH (ref 15–41)
Alkaline Phosphatase: 183 U/L — ABNORMAL HIGH (ref 38–126)
BUN: 9 mg/dL (ref 6–20)
CO2: 23 mmol/L (ref 22–32)
CREATININE: 0.64 mg/dL (ref 0.44–1.00)
Calcium: 9.2 mg/dL (ref 8.9–10.3)
Chloride: 105 mmol/L (ref 101–111)
GFR calc Af Amer: >60 mL/min (ref >60)
GFR calc non Af Amer: >60 mL/min (ref >60)
Glucose, Bld: 94 mg/dL (ref 65–99)
POTASSIUM: 3.7 mmol/L (ref 3.5–5.1)
Sodium: 142 mmol/L (ref 135–145)
TOTAL PROTEIN: 8.4 g/dL — AB (ref 6.5–8.1)
Total Bilirubin: 1 mg/dL (ref 0.3–1.2)

## 2014-11-24 LAB — SALICYLATE LEVEL: Salicylate Lvl: <4 mg/dL (ref 2.8–30.0)

## 2014-11-24 LAB — CBC WITH DIFFERENTIAL/PLATELET
BASOS ABS: 0 10*3/uL (ref 0.0–0.1)
Basophils Relative: 0 % (ref 0–1)
EOS ABS: 0.1 10*3/uL (ref 0.0–0.7)
Eosinophils Relative: 1 % (ref 0–5)
HCT: 40.4 % (ref 36.0–46.0)
HEMOGLOBIN: 13.7 g/dL (ref 12.0–15.0)
Lymphocytes Relative: 40 % (ref 12–46)
Lymphs Abs: 3.7 10*3/uL (ref 0.7–4.0)
MCH: 32.9 pg (ref 26.0–34.0)
MCHC: 33.9 g/dL (ref 30.0–36.0)
MCV: 96.9 fL (ref 78.0–100.0)
MONO ABS: 0.6 10*3/uL (ref 0.1–1.0)
Monocytes Relative: 7 % (ref 3–12)
Neutro Abs: 4.8 10*3/uL (ref 1.7–7.7)
Neutrophils Relative %: 52 % (ref 43–77)
Platelets: 270 10*3/uL (ref 150–400)
RBC: 4.17 MIL/uL (ref 3.87–5.11)
RDW: 15.5 % (ref 11.5–15.5)
WBC: 9.3 10*3/uL (ref 4.0–10.5)

## 2014-11-24 LAB — I-STAT BETA HCG BLOOD, ED (MC, WL, AP ONLY): I-stat hCG, quantitative: <5 m[IU]/mL (ref <5)

## 2014-11-24 LAB — LIPASE, BLOOD: Lipase: 27 U/L (ref 22–51)

## 2014-11-24 LAB — VALPROIC ACID LEVEL: Valproic Acid Lvl: <10 ug/mL — ABNORMAL LOW (ref 50.0–100.0)

## 2014-11-24 LAB — ACETAMINOPHEN LEVEL

## 2014-11-24 LAB — ETHANOL: ALCOHOL ETHYL (B): 410 mg/dL — AB (ref <5)

## 2014-11-24 MED ORDER — LORAZEPAM 1 MG PO TABS: 0.0000 mg | ORAL_TABLET | Freq: Four times a day (QID) | ORAL | Status: DC

## 2014-11-24 MED ORDER — ADULT MULTIVITAMIN W/MINERALS CH
1.0000 | ORAL_TABLET | Freq: Every day | ORAL | Status: DC
  Administered 2014-11-24: 1 via ORAL
  Filled 2014-11-24: qty 1

## 2014-11-24 MED ORDER — LORAZEPAM 1 MG PO TABS: 0.0000 mg | ORAL_TABLET | Freq: Two times a day (BID) | ORAL | Status: DC

## 2014-11-24 MED ORDER — THIAMINE HCL 100 MG/ML IJ SOLN: 100.0000 mg | Freq: Every day | INTRAMUSCULAR | Status: DC

## 2014-11-24 MED ORDER — VITAMIN B-1 100 MG PO TABS
100.0000 mg | ORAL_TABLET | Freq: Every day | ORAL | Status: DC
  Administered 2014-11-24: 100 mg via ORAL
  Filled 2014-11-24: qty 1

## 2014-11-24 MED ORDER — LORAZEPAM 2 MG/ML IJ SOLN: 1.0000 mg | Freq: Four times a day (QID) | INTRAMUSCULAR | Status: DC | PRN

## 2014-11-24 MED ORDER — HALOPERIDOL LACTATE 5 MG/ML IJ SOLN
5.0000 mg | Freq: Once | INTRAMUSCULAR | Status: AC
  Administered 2014-11-24: 5 mg via INTRAMUSCULAR
  Filled 2014-11-24: qty 1

## 2014-11-24 MED ORDER — SODIUM CHLORIDE 0.9 % IV BOLUS (SEPSIS)
1000.0000 mL | Freq: Once | INTRAVENOUS | Status: AC
  Administered 2014-11-24: 1000 mL via INTRAVENOUS

## 2014-11-24 MED ORDER — LORAZEPAM 2 MG/ML IJ SOLN
1.0000 mg | Freq: Once | INTRAMUSCULAR | Status: AC
  Administered 2014-11-24: 1 mg via INTRAMUSCULAR
  Filled 2014-11-24: qty 1

## 2014-11-24 MED ORDER — LORAZEPAM 1 MG PO TABS: 1.0000 mg | ORAL_TABLET | Freq: Four times a day (QID) | ORAL | Status: DC | PRN

## 2014-11-24 MED ORDER — FOLIC ACID 1 MG PO TABS
1.0000 mg | ORAL_TABLET | Freq: Every day | ORAL | Status: DC
  Administered 2014-11-24: 1 mg via ORAL
  Filled 2014-11-24: qty 1

## 2014-11-24 NOTE — ED Notes (Signed)
Pt belongs with RN documented

## 2014-11-24 NOTE — ED Notes (Signed)
Per ems pt from from physician office, co behavior aggressiveness. Dx bipolar and out of meds for 2 weeks. Pt is laying on stretcher  Calm and cooperative at this moment.

## 2014-11-24 NOTE — ED Notes (Addendum)
Pt crying and cursing, will not let me get vitals. Will try when calmer

## 2014-11-24 NOTE — Discharge Instructions (Signed)
Alcohol Intoxication Alcohol intoxication occurs when the amount of alcohol that a person has consumed impairs his or her ability to mentally and physically function. Alcohol directly impairs the normal chemical activity of the brain. Drinking large amounts of alcohol can lead to changes in mental function and behavior, and it can cause many physical effects that can be harmful.  Alcohol intoxication can range in severity from mild to very severe. Various factors can affect the level of intoxication that occurs, such as the person's age, gender, weight, frequency of alcohol consumption, and the presence of other medical conditions (such as diabetes, seizures, or heart conditions). Dangerous levels of alcohol intoxication may occur when people drink large amounts of alcohol in a short period (binge drinking). Alcohol can also be especially dangerous when combined with certain prescription medicines or "recreational" drugs. SIGNS AND SYMPTOMS Some common signs and symptoms of mild alcohol intoxication include:  Loss of coordination.  Changes in mood and behavior.  Impaired judgment.  Slurred speech. As alcohol intoxication progresses to more severe levels, other signs and symptoms will appear. These may include:  Vomiting.  Confusion and impaired memory.  Slowed breathing.  Seizures.  Loss of consciousness. DIAGNOSIS  Your health care provider will take a medical history and perform a physical exam. You will be asked about the amount and type of alcohol you have consumed. Blood tests will be done to measure the concentration of alcohol in your blood. In many places, your blood alcohol level must be lower than 80 mg/dL (0.08%) to legally drive. However, many dangerous effects of alcohol can occur at much lower levels.  TREATMENT  People with alcohol intoxication often do not require treatment. Most of the effects of alcohol intoxication are temporary, and they go away as the alcohol naturally  leaves the body. Your health care provider will monitor your condition until you are stable enough to go home. Fluids are sometimes given through an IV access tube to help prevent dehydration.  HOME CARE INSTRUCTIONS  Do not drive after drinking alcohol.  Stay hydrated. Drink enough water and fluids to keep your urine clear or pale yellow. Avoid caffeine.   Only take over-the-counter or prescription medicines as directed by your health care provider.  SEEK MEDICAL CARE IF:   You have persistent vomiting.   You do not feel better after a few days.  You have frequent alcohol intoxication. Your health care provider can help determine if you should see a substance use treatment counselor. SEEK IMMEDIATE MEDICAL CARE IF:   You become shaky or tremble when you try to stop drinking.   You shake uncontrollably (seizure).   You throw up (vomit) blood. This may be bright red or may look like black coffee grounds.   You have blood in your stool. This may be bright red or may appear as a black, tarry, bad smelling stool.   You become lightheaded or faint.  MAKE SURE YOU:   Understand these instructions.  Will watch your condition.  Will get help right away if you are not doing well or get worse. Document Released: 03/13/2005 Document Revised: 02/03/2013 Document Reviewed: 11/06/2012 [facility] Patient Information 2015 [place], [place]. This information is not intended to replace advice given to you by your health care provider. Make sure you discuss any questions you have with your health care provider.  Alcohol Withdrawal Anytime drug use is interfering with normal living activities it has become abuse. This includes problems with family and friends. Psychological dependence has developed  when your mind tells you that the drug is needed. This is usually followed by physical dependence when a continuing increase of drugs are required to get the same feeling or "high." This is known as  addiction or chemical dependency. A person's risk is much higher if there is a history of chemical dependency in the family. Mild Withdrawal Following Stopping Alcohol, When Addiction or Chemical Dependency Has Developed When a person has developed tolerance to alcohol, any sudden stopping of alcohol can cause uncomfortable physical symptoms. Most of the time these are mild and consist of tremors in the hands and increases in heart rate, breathing, and temperature. Sometimes these symptoms are associated with anxiety, panic attacks, and bad dreams. There may also be stomach upset. Normal sleep patterns are often interrupted with periods of inability to sleep (insomnia). This may last for 6 months. Because of this discomfort, many people choose to continue drinking to get rid of this discomfort and to try to feel normal. Severe Withdrawal with Decreased or No Alcohol Intake, When Addiction or Chemical Dependency Has Developed About five percent of alcoholics will develop signs of severe withdrawal when they stop using alcohol. One sign of this is development of generalized seizures (convulsions). Other signs of this are severe agitation and confusion. This may be associated with believing in things which are not real or seeing things which are not really there (delusions and hallucinations). Vitamin deficiencies are usually present if alcohol intake has been long-term. Treatment for this most often requires hospitalization and close observation. Addiction can only be helped by stopping use of all chemicals. This is hard but may save your life. With continual alcohol use, possible outcomes are usually loss of self respect and esteem, violence, and death. Addiction cannot be cured but it can be stopped. This often requires outside help and the care of professionals. Treatment centers are listed in the yellow pages under Cocaine, Narcotics, and Alcoholics Anonymous. Most hospitals and clinics can refer you to a  specialized care center. It is not necessary for you to go through the uncomfortable symptoms of withdrawal. Your caregiver can provide you with medicines that will help you through this difficult period. Try to avoid situations, friends, or drugs that made it possible for you to keep using alcohol in the past. Learn how to say no. It takes a long period of time to overcome addictions to all drugs, including alcohol. There may be many times when you feel as though you want a drink. After getting rid of the physical addiction and withdrawal, you will have a lessening of the craving which tells you that you need alcohol to feel normal. Call your caregiver if more support is needed. Learn who to talk to in your family and among your friends so that during these periods you can receive outside help. Alcoholics Anonymous (AA) has helped many people over the years. To get further help, contact AA or call your caregiver, counselor, or clergyperson. Al-Anon and Alateen are support groups for friends and family members of an alcoholic. The people who love and care for an alcoholic often need help, too. For information about these organizations, check your phone directory or call a local alcoholism treatment center.  SEEK IMMEDIATE MEDICAL CARE IF:   You have a seizure.  You have a fever.  You experience uncontrolled vomiting or you vomit up blood. This may be bright red or look like black coffee grounds.  You have blood in the stool. This may be bright  red or appear as a black, tarry, bad-smelling stool.  You become lightheaded or faint. Do not drive if you feel this way. Have someone else drive you or call 147 for help.  You become more agitated or confused.  You develop uncontrolled anxiety.  You begin to see things that are not really there (hallucinate). Your caregiver has determined that you completely understand your medical condition, and that your mental state is back to normal. You understand  that you have been treated for alcohol withdrawal, have agreed not to drink any alcohol for a minimum of 1 day, will not operate a car or other machinery for 24 hours, and have had an opportunity to ask any questions about your condition. Document Released: 03/13/2005 Document Revised: 08/26/2011 Document Reviewed: 01/20/2008 [facility] Patient Information 2015 [place], [place]. This information is not intended to replace advice given to you by your health care provider. Make sure you discuss any questions you have with your health care provider.

## 2014-11-24 NOTE — ED Notes (Signed)
Pt's friend at bedside and gave permission to waste the two bottles of Vodka that pt brought in in her purse. Corene Cornea, RN poured the contents in the sink.

## 2014-11-24 NOTE — ED Provider Notes (Signed)
  Physical Exam  BP 127/84 mmHg  Pulse 105  Temp(Src) 97.7 F (36.5 C) (Oral)  Resp 20  SpO2 97%  LMP   Physical Exam  ED Course  Procedures  MDM   Assuming care of patient from Dr. Darl Householder Patient in the ED for alcohol intoxication and aggressive behavior.                   Awaiting sobriety. Workup thus far is neg except for etoh level Patient had no complains, no concerns from the nursing side.   Patient is clinically sober now. She is talking coherently, gait is normal, and is demonstrating rational thought process. We shall discharge her shortly, and we have discussed the warning signs of alcohol withdrawal with her verbally, and the information will be provided with the discharge instructions as well.  Pt being discharged to her best friend who is in town for a week, and who takes responsibility for the patient. Pt and friend made aware that pt will not be clinically sober until 3 am.          Stacy Biles, MD 11/24/14 1655

## 2014-11-24 NOTE — ED Provider Notes (Signed)
CSN: [id number]     Arrival date & time 11/24/14  1201 History   First MD Initiated Contact with Patient 11/24/14 1221     Chief Complaint  Patient presents with  . bipolar out of meds      (Consider location/radiation/quality/duration/timing/severity/associated sxs/prior Treatment) The history is provided by the patient. The history is limited by the condition of the patient.  Stacy Pena is a 49 y.o. female history of bipolar, hypertension, pancreatitis, alcoholism here presenting with aggressive behavior, abdominal pain. Patient was recently admitted for acute pancreatitis from alcohol. Patient was found to be agitated at her doctor's office. States that she has not been taking her meds for the last 2 weeks. Patient refused to answer questions and refuses to tell me if she wants to hurt herself or other people but has been yelling at staff and cursing. She has been refusing vitals and refused to answer many questions. She grabbed my hand and states that she has been having right-sided abdominal pain around the liver. She has some vomiting as well. States that she doesn't drink alcohol but has a bottle of vodka on her.    Level V caveat- refused to talk   Past Medical History  Diagnosis Date  . Bipolar 1 disorder   . Hypertension   . Alcoholism   . Pancreatitis 07/2014  . GERD (gastroesophageal reflux disease)    Past Surgical History  Procedure Laterality Date  . Ovarian cyst removal    . Appendectomy    . Wisdom tooth extraction     No family history on file. History  Substance Use Topics  . Smoking status: Current Every Day Smoker -- 0.25 packs/day for 10 years    Types: Cigarettes  . Smokeless tobacco: Never Used  . Alcohol Use: Yes     Comment: heavily   OB History    No data available     Review of Systems  Gastrointestinal: Positive for abdominal pain.  All other systems reviewed and are negative.     Allergies  Review of patient's allergies indicates no  known allergies.  Home Medications   Prior to Admission medications   Medication Sig Start Date End Date Taking? Authorizing Provider  citalopram (CELEXA) 10 MG tablet Take 10 mg by mouth daily.    Historical Provider, MD  divalproex (DEPAKOTE ER) 500 MG 24 hr tablet Take 500 mg by mouth daily.    Historical Provider, MD  LORazepam (ATIVAN) 0.5 MG tablet Take 0.5 mg by mouth 2 (two) times daily.    Historical Provider, MD  losartan-hydrochlorothiazide (HYZAAR) 100-12.5 MG per tablet Take 1 tablet by mouth daily.    Historical Provider, MD  omeprazole (PRILOSEC) 40 MG capsule Take 40 mg by mouth daily.    Historical Provider, MD  oxyCODONE (OXY IR/ROXICODONE) 5 MG immediate release tablet Take 1 tablet (5 mg total) by mouth every 6 (six) hours as needed for severe pain. 08/01/14   Delfina Redwood, MD  promethazine (PHENERGAN) 12.5 MG tablet Take 1 tablet (12.5 mg total) by mouth every 6 (six) hours as needed for nausea or vomiting. 08/01/14   Delfina Redwood, MD   BP 127/84 mmHg  Pulse 105  Temp(Src) 97.7 F (36.5 C) (Oral)  Resp 20  SpO2 97%  LMP  Physical Exam  Constitutional:  Agitated, smells of alcohol.   HENT:  Head: Normocephalic.  MM dry   Eyes:  + obvious horizontal and rotatory nystagmus at rest, refused to follow directions.  Neck: Normal range of motion. Neck supple.  Cardiovascular: Normal rate, regular rhythm and normal heart sounds.   Pulmonary/Chest: Effort normal and breath sounds normal. No respiratory distress. She has no wheezes. She has no rales.  Abdominal:  Distended, ? Hepatomegaly. Mild fluid wave. Minimal RUQ tenderness.   Musculoskeletal: Normal range of motion.  Neurological:  Intoxicated. Alert. Agitated. Moving all extremities, refused to follow commands.   Skin: Skin is warm and dry.  Psychiatric:  Agitated. Poor judgment   Nursing note and vitals reviewed.   ED Course  Procedures (including critical care time) Labs Review Labs Reviewed   COMPREHENSIVE METABOLIC PANEL - Abnormal; Notable for the following:    Total Protein 8.4 (*)    AST 186 (*)    ALT 158 (*)    Alkaline Phosphatase 183 (*)    All other components within normal limits  ETHANOL - Abnormal; Notable for the following:    Alcohol, Ethyl (B) 410 (*)    All other components within normal limits  ACETAMINOPHEN LEVEL - Abnormal; Notable for the following:    Acetaminophen (Tylenol), Serum <10 (*)    All other components within normal limits  VALPROIC ACID LEVEL - Abnormal; Notable for the following:    Valproic Acid Lvl <10 (*)    All other components within normal limits  CBC WITH DIFFERENTIAL/PLATELET  SALICYLATE LEVEL  LIPASE, BLOOD  URINE RAPID DRUG SCREEN (HOSP PERFORMED) NOT AT [facility]  URINALYSIS, ROUTINE W REFLEX MICROSCOPIC (NOT AT [facility])  I-STAT BETA HCG BLOOD, ED ([facility], [facility], [facility] ONLY)    Imaging Review Ct Abdomen Pelvis Wo Contrast  11/24/2014   CLINICAL DATA:  Acute right flank pain.  EXAM: CT ABDOMEN AND PELVIS WITHOUT CONTRAST  TECHNIQUE: Multidetector CT imaging of the abdomen and pelvis was performed following the standard protocol without IV contrast.  COMPARISON:  CT scan dated 07/27/2014 and abdominal MRI dated 10/09/2014  FINDINGS: Lower chest:  Normal.  Hepatobiliary: Hepatomegaly with extensive hepatic steatosis. The 2.1 cm enhancing lesion in the posterior aspect of the right lobe previously demonstrated to be consistent with an adenoma.  Pancreas: Normal.  Spleen: Normal.  Adrenals/Urinary Tract: Normal kidneys and ureters in adrenal glands. The bladder is markedly distended above the umbilicus.  Stomach/Bowel: Normal.  The appendix has been removed.  Vascular/Lymphatic: Scattered calcifications in the abdominal aorta.  Reproductive: 5 cm fibroid in the left side of the uterus. Right ovary is normal. Left ovary is not discretely identified.  Other: No free fluid.  No free air.  Musculoskeletal: Degenerative disc disease at L3-4.  IMPRESSION: Marked  distention of the bladder.  Hepatomegaly with hepatic steatosis. Stable adenoma in the right lobe of the liver.  5 cm fibroid in the left side of the uterus.   Electronically Signed   By: Lorriane Shire M.D.   On: 11/24/2014 15:09   Ct Head Wo Contrast  11/24/2014   CLINICAL DATA:  Nystagmus.  Dizzy.  Aggressive behavior.  EXAM: CT HEAD WITHOUT CONTRAST  TECHNIQUE: Contiguous axial images were obtained from the base of the skull through the vertex without intravenous contrast.  COMPARISON:  None.  FINDINGS: No acute cortical infarct, hemorrhage, or mass lesion is present. The ventricles are of normal size. No significant extra-axial fluid collection is evident. The paranasal sinuses and mastoid air cells are clear. The calvarium is intact.  IMPRESSION: Negative CT of the head.   Electronically Signed   By: San Morelle M.D.   On: 11/24/2014 15:05  EKG Interpretation None      MDM   Final diagnoses:  Flank pain, acute    Faylynn Stamos is a 49 y.o. female here with aggressive behavior, ab pain. Patient IVC by me since she is agitated and psychotic. Has nystagmus which is likely from Wernicke-Korsakoff or other drug or alcohol use, less likely mass or stroke. 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 Will get labs, CT head/ab/pel, tox.    4:02 PM Friend is here in the ED. States that she has been drinking alcohol. Nurse pulled out two bottles of vodka from her purse. Called daughter, who states that she hasn't been taking her meds for about 2 weeks and doesn't have any at home. Labs at baseline. CT head/ab/pel unremarkable. Friend states that she is agitated when she is intoxicated. Will start on CIWA. Signed out to Dr. Mariane Masters to reassess when sober.     Wandra Arthurs, MD 11/24/14 [phone]

## 2014-11-28 ENCOUNTER — Encounter (HOSPITAL_COMMUNITY): Payer: Self-pay

## 2014-11-28 ENCOUNTER — Observation Stay (HOSPITAL_COMMUNITY)
Admission: EM | Admit: 2014-11-28 | Discharge: 2014-11-30 | Disposition: A | Discharge status: Home / Self Care | Payer: 59 | Attending: Internal Medicine | Admitting: Internal Medicine

## 2014-11-28 DIAGNOSIS — F17210 Nicotine dependence, cigarettes, uncomplicated: Secondary | ICD-10-CM | POA: Insufficient documentation

## 2014-11-28 DIAGNOSIS — F32A Depression, unspecified: Secondary | ICD-10-CM

## 2014-11-28 DIAGNOSIS — R45851 Suicidal ideations: Secondary | ICD-10-CM

## 2014-11-28 DIAGNOSIS — R7989 Other specified abnormal findings of blood chemistry: Secondary | ICD-10-CM | POA: Diagnosis present

## 2014-11-28 DIAGNOSIS — F10129 Alcohol abuse with intoxication, unspecified: Secondary | ICD-10-CM

## 2014-11-28 DIAGNOSIS — F419 Anxiety disorder, unspecified: Secondary | ICD-10-CM | POA: Diagnosis present

## 2014-11-28 DIAGNOSIS — K7010 Alcoholic hepatitis without ascites: Secondary | ICD-10-CM | POA: Diagnosis not present

## 2014-11-28 DIAGNOSIS — T50902A Poisoning by unspecified drugs, medicaments and biological substances, intentional self-harm, initial encounter: Secondary | ICD-10-CM | POA: Diagnosis not present

## 2014-11-28 DIAGNOSIS — F3162 Bipolar disorder, current episode mixed, moderate: Secondary | ICD-10-CM | POA: Diagnosis present

## 2014-11-28 DIAGNOSIS — F1020 Alcohol dependence, uncomplicated: Secondary | ICD-10-CM | POA: Diagnosis present

## 2014-11-28 DIAGNOSIS — F329 Major depressive disorder, single episode, unspecified: Secondary | ICD-10-CM | POA: Diagnosis present

## 2014-11-28 DIAGNOSIS — R112 Nausea with vomiting, unspecified: Secondary | ICD-10-CM | POA: Diagnosis present

## 2014-11-28 DIAGNOSIS — F319 Bipolar disorder, unspecified: Secondary | ICD-10-CM | POA: Diagnosis not present

## 2014-11-28 DIAGNOSIS — Z79899 Other long term (current) drug therapy: Secondary | ICD-10-CM | POA: Diagnosis not present

## 2014-11-28 DIAGNOSIS — I10 Essential (primary) hypertension: Secondary | ICD-10-CM | POA: Diagnosis present

## 2014-11-28 DIAGNOSIS — R945 Abnormal results of liver function studies: Secondary | ICD-10-CM | POA: Diagnosis present

## 2014-11-28 DIAGNOSIS — F10929 Alcohol use, unspecified with intoxication, unspecified: Secondary | ICD-10-CM | POA: Diagnosis present

## 2014-11-28 DIAGNOSIS — K219 Gastro-esophageal reflux disease without esophagitis: Secondary | ICD-10-CM | POA: Diagnosis present

## 2014-11-28 DIAGNOSIS — R1011 Right upper quadrant pain: Secondary | ICD-10-CM

## 2014-11-28 DIAGNOSIS — Y929 Unspecified place or not applicable: Secondary | ICD-10-CM | POA: Diagnosis not present

## 2014-11-28 DIAGNOSIS — F1024 Alcohol dependence with alcohol-induced mood disorder: Secondary | ICD-10-CM | POA: Insufficient documentation

## 2014-11-28 DIAGNOSIS — R109 Unspecified abdominal pain: Secondary | ICD-10-CM | POA: Diagnosis present

## 2014-11-28 DIAGNOSIS — R197 Diarrhea, unspecified: Secondary | ICD-10-CM

## 2014-11-28 DIAGNOSIS — Z9114 Patient's other noncompliance with medication regimen: Secondary | ICD-10-CM | POA: Diagnosis not present

## 2014-11-28 DIAGNOSIS — F10239 Alcohol dependence with withdrawal, unspecified: Principal | ICD-10-CM | POA: Insufficient documentation

## 2014-11-28 HISTORY — DX: Major depressive disorder, single episode, unspecified: F32.9

## 2014-11-28 HISTORY — DX: Depression, unspecified: F32.A

## 2014-11-28 LAB — CBC WITH DIFFERENTIAL/PLATELET
BASOS ABS: 0 10*3/uL (ref 0.0–0.1)
BASOS PCT: 1 % (ref 0–1)
Eosinophils Absolute: 0.1 10*3/uL (ref 0.0–0.7)
Eosinophils Relative: 2 % (ref 0–5)
HEMATOCRIT: 40.6 % (ref 36.0–46.0)
Hemoglobin: 13.7 g/dL (ref 12.0–15.0)
LYMPHS PCT: 45 % (ref 12–46)
Lymphs Abs: 3.3 10*3/uL (ref 0.7–4.0)
MCH: 33.1 pg (ref 26.0–34.0)
MCHC: 33.7 g/dL (ref 30.0–36.0)
MCV: 98.1 fL (ref 78.0–100.0)
MONO ABS: 0.4 10*3/uL (ref 0.1–1.0)
Monocytes Relative: 6 % (ref 3–12)
NEUTROS PCT: 46 % (ref 43–77)
Neutro Abs: 3.4 10*3/uL (ref 1.7–7.7)
Platelets: 270 10*3/uL (ref 150–400)
RBC: 4.14 MIL/uL (ref 3.87–5.11)
RDW: 15.6 % — ABNORMAL HIGH (ref 11.5–15.5)
WBC: 7.4 10*3/uL (ref 4.0–10.5)

## 2014-11-28 LAB — COMPREHENSIVE METABOLIC PANEL
ALK PHOS: 175 U/L — AB (ref 38–126)
ALT: 237 U/L — AB (ref 14–54)
ANION GAP: 12 (ref 5–15)
AST: 354 U/L — ABNORMAL HIGH (ref 15–41)
Albumin: 4.6 g/dL (ref 3.5–5.0)
BILIRUBIN TOTAL: 0.9 mg/dL (ref 0.3–1.2)
BUN: <5 mg/dL — ABNORMAL LOW (ref 6–20)
CHLORIDE: 113 mmol/L — AB (ref 101–111)
CO2: 22 mmol/L (ref 22–32)
Calcium: 9.4 mg/dL (ref 8.9–10.3)
Creatinine, Ser: 0.63 mg/dL (ref 0.44–1.00)
GFR calc non Af Amer: >60 mL/min (ref >60)
GLUCOSE: 97 mg/dL (ref 65–99)
POTASSIUM: 3.6 mmol/L (ref 3.5–5.1)
Sodium: 147 mmol/L — ABNORMAL HIGH (ref 135–145)
Total Protein: 8.6 g/dL — ABNORMAL HIGH (ref 6.5–8.1)

## 2014-11-28 LAB — URINALYSIS, ROUTINE W REFLEX MICROSCOPIC
Bilirubin Urine: NEGATIVE
GLUCOSE, UA: NEGATIVE mg/dL
HGB URINE DIPSTICK: NEGATIVE
KETONES UR: NEGATIVE mg/dL
Leukocytes, UA: NEGATIVE
Nitrite: NEGATIVE
PH: 5 (ref 5.0–8.0)
PROTEIN: NEGATIVE mg/dL
Specific Gravity, Urine: 1.006 (ref 1.005–1.030)
Urobilinogen, UA: 0.2 mg/dL (ref 0.0–1.0)

## 2014-11-28 LAB — SALICYLATE LEVEL: Salicylate Lvl: <4 mg/dL (ref 2.8–30.0)

## 2014-11-28 LAB — POC URINE PREG, ED: Preg Test, Ur: NEGATIVE

## 2014-11-28 LAB — ETHANOL: ALCOHOL ETHYL (B): 386 mg/dL — AB (ref <5)

## 2014-11-28 LAB — ACETAMINOPHEN LEVEL
Acetaminophen (Tylenol), Serum: <10 ug/mL — ABNORMAL LOW (ref 10–30)
Acetaminophen (Tylenol), Serum: <10 ug/mL — ABNORMAL LOW (ref 10–30)

## 2014-11-28 MED ORDER — VITAMIN B-1 100 MG PO TABS
100.0000 mg | ORAL_TABLET | Freq: Every day | ORAL | Status: DC
  Administered 2014-11-29 – 2014-11-30 (×2): 100 mg via ORAL
  Filled 2014-11-28 (×2): qty 1

## 2014-11-28 MED ORDER — FOLIC ACID 1 MG PO TABS
1.0000 mg | ORAL_TABLET | Freq: Every day | ORAL | Status: DC
  Administered 2014-11-29 – 2014-11-30 (×2): 1 mg via ORAL
  Filled 2014-11-28 (×2): qty 1

## 2014-11-28 MED ORDER — SODIUM CHLORIDE 0.9 % IV BOLUS (SEPSIS)
1000.0000 mL | Freq: Once | INTRAVENOUS | Status: AC
  Administered 2014-11-28: 1000 mL via INTRAVENOUS

## 2014-11-28 MED ORDER — LORAZEPAM 2 MG/ML IJ SOLN
1.0000 mg | Freq: Once | INTRAMUSCULAR | Status: AC
  Administered 2014-11-28: 1 mg via INTRAMUSCULAR
  Filled 2014-11-28: qty 1

## 2014-11-28 MED ORDER — LORAZEPAM 1 MG PO TABS
0.0000 mg | ORAL_TABLET | Freq: Four times a day (QID) | ORAL | Status: AC
  Administered 2014-11-29: 2 mg via ORAL
  Administered 2014-11-29 – 2014-11-30 (×4): 1 mg via ORAL
  Filled 2014-11-28: qty 1
  Filled 2014-11-28: qty 2
  Filled 2014-11-28 (×3): qty 1

## 2014-11-28 MED ORDER — ZIPRASIDONE MESYLATE 20 MG IM SOLR
INTRAMUSCULAR | Status: AC
  Filled 2014-11-28: qty 20

## 2014-11-28 MED ORDER — LORAZEPAM 1 MG PO TABS: 0.0000 mg | ORAL_TABLET | Freq: Two times a day (BID) | ORAL | Status: DC

## 2014-11-28 MED ORDER — LORAZEPAM 2 MG/ML IJ SOLN: 1.0000 mg | Freq: Four times a day (QID) | INTRAMUSCULAR | Status: DC | PRN

## 2014-11-28 MED ORDER — LORAZEPAM 1 MG PO TABS
1.0000 mg | ORAL_TABLET | Freq: Four times a day (QID) | ORAL | Status: DC | PRN
  Administered 2014-11-29 – 2014-11-30 (×3): 1 mg via ORAL
  Filled 2014-11-28 (×3): qty 1

## 2014-11-28 MED ORDER — ADULT MULTIVITAMIN W/MINERALS CH
1.0000 | ORAL_TABLET | Freq: Every day | ORAL | Status: DC
  Administered 2014-11-29 – 2014-11-30 (×2): 1 via ORAL
  Filled 2014-11-28 (×2): qty 1

## 2014-11-28 MED ORDER — THIAMINE HCL 100 MG/ML IJ SOLN
100.0000 mg | Freq: Every day | INTRAMUSCULAR | Status: DC
  Administered 2014-11-28: 100 mg via INTRAVENOUS
  Filled 2014-11-28: qty 1
  Filled 2014-11-28: qty 2
  Filled 2014-11-28: qty 1

## 2014-11-28 MED ORDER — ZIPRASIDONE MESYLATE 20 MG IM SOLR
10.0000 mg | Freq: Once | INTRAMUSCULAR | Status: AC
  Administered 2014-11-28: 10 mg via INTRAMUSCULAR
  Filled 2014-11-28: qty 20

## 2014-11-28 MED ORDER — STERILE WATER FOR INJECTION IJ SOLN
INTRAMUSCULAR | Status: AC
  Administered 2014-11-28: 1 mL
  Filled 2014-11-28: qty 10

## 2014-11-28 NOTE — ED Notes (Signed)
Spoke with Dr. Betsey Holiday about the response with poison control. Dr. Betsey Holiday would like to not give N-acetylcysteine and continue to monitor. Spoke back with Roshonna to inform her of what Dr. Betsey Holiday recommended.

## 2014-11-28 NOTE — ED Notes (Signed)
Per EMS- Patient's neighbor called EMS. Neighbor reported that the patient said she took a bunch of pills. Unknown drug and unknown amount. All pill bottles were empty except Citalopram and B-12 tabs. Patient was combative when they first arrived, then she was tearful.Paatient also admitted to drinking Vodka as well. Patient told EMS she wanted to die because her children were mean to her.

## 2014-11-28 NOTE — ED Notes (Signed)
[facility] [facility] notified of patient's OD of unknown medication and unknown amount. Empty bottles read to Poison control. Suggested Supportive care and watch QTc interval

## 2014-11-28 NOTE — ED Notes (Signed)
Spoke with Roshonna with poison control. PS is recommending administering N-Acetylcycteine 150mg /kg for one hour (loading dose) and followed by 150/5kg over 23 hrs. Also, recheck liver enzymes and continue with cardiac monitoring.

## 2014-11-28 NOTE — ED Notes (Signed)
Bed: WA10 Expected date:  Expected time:  Means of arrival:  Comments: EMS 

## 2014-11-28 NOTE — ED Notes (Signed)
Patient OOB, yelling and screaming then slammed her door shut. Security, [employer], and EDP in the room.
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
## 2014-11-29 ENCOUNTER — Encounter (HOSPITAL_COMMUNITY): Payer: Self-pay

## 2014-11-29 DIAGNOSIS — F319 Bipolar disorder, unspecified: Secondary | ICD-10-CM | POA: Diagnosis not present

## 2014-11-29 DIAGNOSIS — T50902A Poisoning by unspecified drugs, medicaments and biological substances, intentional self-harm, initial encounter: Secondary | ICD-10-CM | POA: Diagnosis not present

## 2014-11-29 DIAGNOSIS — R45851 Suicidal ideations: Secondary | ICD-10-CM | POA: Diagnosis not present

## 2014-11-29 DIAGNOSIS — F10129 Alcohol abuse with intoxication, unspecified: Secondary | ICD-10-CM | POA: Diagnosis not present

## 2014-11-29 LAB — CBC
HCT: 35.1 % — ABNORMAL LOW (ref 36.0–46.0)
Hemoglobin: 11.5 g/dL — ABNORMAL LOW (ref 12.0–15.0)
MCH: 32.6 pg (ref 26.0–34.0)
MCHC: 32.8 g/dL (ref 30.0–36.0)
MCV: 99.4 fL (ref 78.0–100.0)
Platelets: 229 10*3/uL (ref 150–400)
RBC: 3.53 MIL/uL — ABNORMAL LOW (ref 3.87–5.11)
RDW: 15.8 % — ABNORMAL HIGH (ref 11.5–15.5)
WBC: 5.2 10*3/uL (ref 4.0–10.5)

## 2014-11-29 LAB — COMPREHENSIVE METABOLIC PANEL
ALT: 200 U/L — AB (ref 14–54)
AST: 276 U/L — AB (ref 15–41)
Albumin: 3.9 g/dL (ref 3.5–5.0)
Alkaline Phosphatase: 152 U/L — ABNORMAL HIGH (ref 38–126)
Anion gap: 10 (ref 5–15)
BILIRUBIN TOTAL: 1.1 mg/dL (ref 0.3–1.2)
BUN: 7 mg/dL (ref 6–20)
CHLORIDE: 107 mmol/L (ref 101–111)
CO2: 22 mmol/L (ref 22–32)
CREATININE: 0.56 mg/dL (ref 0.44–1.00)
Calcium: 8.7 mg/dL — ABNORMAL LOW (ref 8.9–10.3)
GFR calc Af Amer: >60 mL/min (ref >60)
GFR calc non Af Amer: >60 mL/min (ref >60)
Glucose, Bld: 82 mg/dL (ref 65–99)
POTASSIUM: 3.4 mmol/L — AB (ref 3.5–5.1)
SODIUM: 139 mmol/L (ref 135–145)
Total Protein: 7 g/dL (ref 6.5–8.1)

## 2014-11-29 LAB — RAPID URINE DRUG SCREEN, HOSP PERFORMED
AMPHETAMINES: NOT DETECTED
BARBITURATES: NOT DETECTED
Benzodiazepines: NOT DETECTED
Cocaine: NOT DETECTED
Opiates: NOT DETECTED
Tetrahydrocannabinol: NOT DETECTED

## 2014-11-29 LAB — HIV ANTIBODY (ROUTINE TESTING W REFLEX): HIV SCREEN 4TH GENERATION: NONREACTIVE

## 2014-11-29 LAB — LIPASE, BLOOD: LIPASE: 24 U/L (ref 22–51)

## 2014-11-29 MED ORDER — ALUM & MAG HYDROXIDE-SIMETH 200-200-20 MG/5ML PO SUSP: 30.0000 mL | Freq: Four times a day (QID) | ORAL | Status: DC | PRN

## 2014-11-29 MED ORDER — SODIUM CHLORIDE 0.9 % IV SOLN: INTRAVENOUS | Status: DC

## 2014-11-29 MED ORDER — HYDROXYZINE HCL 50 MG/ML IM SOLN
25.0000 mg | Freq: Four times a day (QID) | INTRAMUSCULAR | Status: DC | PRN
  Filled 2014-11-29: qty 0.5

## 2014-11-29 MED ORDER — FAMOTIDINE IN NACL 20-0.9 MG/50ML-% IV SOLN
20.0000 mg | Freq: Two times a day (BID) | INTRAVENOUS | Status: DC
  Administered 2014-11-29 (×3): 20 mg via INTRAVENOUS
  Filled 2014-11-29 (×4): qty 50

## 2014-11-29 MED ORDER — SODIUM CHLORIDE 0.9 % IJ SOLN
3.0000 mL | Freq: Two times a day (BID) | INTRAMUSCULAR | Status: DC
  Administered 2014-11-29 – 2014-11-30 (×2): 3 mL via INTRAVENOUS

## 2014-11-29 MED ORDER — HEPARIN SODIUM (PORCINE) 5000 UNIT/ML IJ SOLN
5000.0000 [IU] | Freq: Three times a day (TID) | INTRAMUSCULAR | Status: DC
Start: 2014-11-29 — End: 2014-11-30
  Administered 2014-11-29 – 2014-11-30 (×5): 5000 [IU] via SUBCUTANEOUS
  Filled 2014-11-29 (×8): qty 1

## 2014-11-29 MED ORDER — NICOTINE 21 MG/24HR TD PT24
21.0000 mg | MEDICATED_PATCH | Freq: Every day | TRANSDERMAL | Status: DC
  Administered 2014-11-29 – 2014-11-30 (×2): 21 mg via TRANSDERMAL
  Filled 2014-11-29 (×2): qty 1

## 2014-11-29 MED ORDER — LORAZEPAM 0.5 MG PO TABS: 0.5000 mg | ORAL_TABLET | Freq: Two times a day (BID) | ORAL | Status: DC | PRN

## 2014-11-29 MED ORDER — CITALOPRAM HYDROBROMIDE 10 MG PO TABS
10.0000 mg | ORAL_TABLET | Freq: Every day | ORAL | Status: DC
  Administered 2014-11-29: 10 mg via ORAL
  Filled 2014-11-29: qty 1

## 2014-11-29 MED ORDER — DIVALPROEX SODIUM ER 500 MG PO TB24
500.0000 mg | ORAL_TABLET | Freq: Every day | ORAL | Status: DC
  Administered 2014-11-29: 500 mg via ORAL
  Filled 2014-11-29: qty 1

## 2014-11-29 MED ORDER — HYDRALAZINE HCL 20 MG/ML IJ SOLN: 5.0000 mg | INTRAMUSCULAR | Status: DC | PRN

## 2014-11-29 NOTE — Evaluation (Signed)
Physical Therapy Evaluation-1x Patient Details Name: Stacy Pena MRN: [id number] DOB: May 14, 1966 Today's Date: 11/29/2014   History of Present Illness  49 yo female admitted with depression, suicidal ideation. Hx of bipolar d/o, HTn, alcoholism  Clinical Impression  On eval, pt was supervision level for mobility-able to ambulate ~175 feet without difficulty/lob. Pt did report 7/10 bil LE, back soreness. Encouraged pt to mobilize more with nursing supervision. Do not feel pt has any acute PT needs. Recommend daily ambulation with nursing supervision during hospital stay. Will sign off.     Follow Up Recommendations No PT follow up    Equipment Recommendations  None recommended by PT    Recommendations for Other Services       Precautions / Restrictions Precautions Precautions: None Restrictions Weight Bearing Restrictions: No      Mobility  Bed Mobility Overal bed mobility: Independent                Transfers Overall transfer level: Independent                  Ambulation/Gait Ambulation/Gait assistance: Supervision Ambulation Distance (Feet): 175 Feet Assistive device: None Gait Pattern/deviations: WFL(Within Functional Limits)        Stairs Stairs: Yes Stairs assistance: Supervision Stair Management: Alternating pattern;Forwards;One rail Right Number of Stairs: 5    Wheelchair Mobility    Modified Rankin (Stroke Patients Only)       Balance Overall balance assessment: No apparent balance deficits (not formally assessed)                                           Pertinent Vitals/Pain Pain Assessment: 0-10 Pain Score: 7  Pain Location: bil quads, back Pain Descriptors / Indicators: Sore Pain Intervention(s): Monitored during session    Home Living Family/patient expects to be discharged to:: Private residence Living Arrangements: Children   Type of Home: Apartment Home Access: Stairs to enter Entrance  Stairs-Rails: Right Entrance Stairs-Number of Steps: 1 flight Home Layout: One level Home Equipment: None      Prior Function Level of Independence: Independent               Hand Dominance        Extremity/Trunk Assessment   Upper Extremity Assessment: Overall WFL for tasks assessed           Lower Extremity Assessment: Overall WFL for tasks assessed      Cervical / Trunk Assessment: Normal  Communication   Communication: No difficulties  Cognition Arousal/Alertness: Awake/alert Behavior During Therapy: WFL for tasks assessed/performed Overall Cognitive Status: Within Functional Limits for tasks assessed                      General Comments      Exercises        Assessment/Plan    PT Assessment Patent does not need any further PT services  PT Diagnosis     PT Problem List    PT Treatment Interventions     PT Goals (Current goals can be found in the Care Plan section) Acute Rehab PT Goals Patient Stated Goal: none stated PT Goal Formulation: All assessment and education complete, DC therapy    Frequency     Barriers to discharge        Co-evaluation  End of Session Equipment Utilized During Treatment: Gait belt Activity Tolerance: Patient tolerated treatment well Patient left: in bed;with call bell/phone within reach;with nursing/sitter in room      Functional Assessment Tool Used: clinical jdugement Functional Limitation: Mobility: Walking and moving around Mobility: Walking and Moving Around Current Status (E0352): At least 1 percent but less than 20 percent impaired, limited or restricted Mobility: Walking and Moving Around Goal Status [phone]): At least 1 percent but less than 20 percent impaired, limited or restricted Mobility: Walking and Moving Around Discharge Status [phone]): At least 1 percent but less than 20 percent impaired, limited or restricted    Time: 1121-1131 PT Time Calculation (min) (ACUTE  ONLY): 10 min   Charges:   PT Evaluation $Initial PT Evaluation Tier I: 1 Procedure     PT G Codes:   PT G-Codes **NOT FOR INPATIENT CLASS** Functional Assessment Tool Used: clinical jdugement Functional Limitation: Mobility: Walking and moving around Mobility: Walking and Moving Around Current Status (P2162): At least 1 percent but less than 20 percent impaired, limited or restricted Mobility: Walking and Moving Around Goal Status [phone]): At least 1 percent but less than 20 percent impaired, limited or restricted Mobility: Walking and Moving Around Discharge Status [phone]): At least 1 percent but less than 20 percent impaired, limited or restricted    Weston Anna, MPT Pager: [phone]
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
## 2014-11-29 NOTE — Progress Notes (Signed)
Patient Demographics  Stacy Pena, is a 49 y.o. female, DOB - [birth year], HMC:[id number]  Admit date - 11/28/2014   Admitting Physician Ivor Costa, MD  Outpatient Primary MD for the patient is Lujean Amel, MD  LOS - 1   Chief Complaint  Patient presents with  . Drug Overdose       Admission HPI/Brief narrative: 49 year old female with past medical history of alcohol abuse, hypertension, GERD, anxiety, bipolar disorder, history of pancreatitis, presents with suicidal ideation, nausea, vomiting and abdominal pain, as well patient endorses taking extra 2 tablets of Seroquel , patient workup was significant for elevated LFTs secondary to alcohol abuse , patient was [facility] in ED .  Subjective:   Stacy Pena today has, No headache, No chest pain, No abdominal pain - No Nausea, No new weakness tingling or numbness, No Cough - SOB.   Assessment & Plan    Principal Problem:   Bipolar 1 disorder Active Problems:   Abdominal pain   GERD (gastroesophageal reflux disease)   Hypertension   Alcoholism   Anxiety   Suicidal ideation   Abnormal liver function test   Alcohol intoxication   Nausea vomiting and diarrhea   Depression  Patient, bipolar and suicidal ideation  - Continue suicide precaution  - Shin to extra Seroquel will continue with monitoring on telemetry bed  - And tinea home medication include Celexa, Depakote  - Psychiatric consulted, to evaluate the patient today   Alcohol abuse  - Patient was counseled , continue with CIWA protocol - no evidance of DT's.  Nausea, vomiting, diarrhea and abdominal pain: -  no recurrence since admission, likely due to viral gastroenteritis, follow on C. difficile and GI pathogen panel. - lipase WNL.  GERD -Pepcide  Hypertension - Blood fracture is soft on admission. Hold Prinzide - When necessary hydralazine IV  Tobacco abuse  - Nicotine  patch  Elevated LFTs - LFTs trending down, this is due to alcohol induced hepatitis, recent admission in February/2016 with similar presentation. - Follow on hepatitis panel, mature LFTs closely.   Code Status: Full  Family Communication: none at bedside.  Disposition Plan: Ending psych evaluation   Procedures  None   Consults   Psychiatric   Medications  Scheduled Meds: . citalopram  10 mg Oral Daily  . divalproex  500 mg Oral Daily  . famotidine (PEPCID) IV  20 mg Intravenous Q12H  . folic acid  1 mg Oral Daily  . heparin  5,000 Units Subcutaneous 3 times per day  . LORazepam  0-4 mg Oral Q6H   Followed by  . [START ON 11/30/2014] LORazepam  0-4 mg Oral Q12H  . multivitamin with minerals  1 tablet Oral Daily  . nicotine  21 mg Transdermal Daily  . sodium chloride  3 mL Intravenous Q12H  . thiamine  100 mg Oral Daily   Or  . thiamine  100 mg Intravenous Daily   Continuous Infusions:  PRN Meds:.alum & mag hydroxide-simeth, hydrALAZINE, hydrOXYzine, LORazepam **OR** LORazepam, LORazepam  DVT Prophylaxis   Heparin  Lab Results  Component Value Date   PLT 229 11/29/2014    Antibiotics    Anti-infectives    None          Objective:  Filed Vitals:   11/28/14 2343 11/28/14 2345 11/29/14 0547 11/29/14 1317  BP: 162/87 153/83 129/69 147/77  Pulse: 86  84 93  Temp: 98.3 F (36.8 C)  98.6 F (37 C) 98.6 F (37 C)  TempSrc: Oral  Oral Oral  Resp: 20  20 18   Height:   5\' 3"  (1.6 m)   Weight: 71.2 kg (156 lb 15.5 oz)     SpO2: 100%  98% 100%    Wt Readings from Last 3 Encounters:  11/28/14 71.2 kg (156 lb 15.5 oz)  10/09/14 70.761 kg (156 lb)  07/27/14 71.1 kg (156 lb 12 oz)     Intake/Output Summary (Last 24 hours) at 11/29/14 1345 Last data filed at 11/29/14 1317  Gross per 24 hour  Intake   1840 ml  Output      0 ml  Net   1840 ml     Physical Exam  Awake Alert, Oriented X 3, No new F.N deficits, [place].AT,PERRAL Supple Neck,No JVD,  No cervical lymphadenopathy appriciated.  Symmetrical Chest wall movement, Good air movement bilaterally, CTAB RRR,No Gallops,Rubs or new Murmurs, No Parasternal Heave +ve B.Sounds, Abd Soft, No tenderness, No organomegaly appriciated, No rebound - guarding or rigidity. No Cyanosis, Clubbing or edema, No new Rash or bruise   Data Review   Micro Results No results found for this or any previous visit (from the past 240 hour(s)).  Radiology Reports Ct Abdomen Pelvis Wo Contrast  11/24/2014   CLINICAL DATA:  Acute right flank pain.  EXAM: CT ABDOMEN AND PELVIS WITHOUT CONTRAST  TECHNIQUE: Multidetector CT imaging of the abdomen and pelvis was performed following the standard protocol without IV contrast.  COMPARISON:  CT scan dated 07/27/2014 and abdominal MRI dated 10/09/2014  FINDINGS: Lower chest:  Normal.  Hepatobiliary: Hepatomegaly with extensive hepatic steatosis. The 2.1 cm enhancing lesion in the posterior aspect of the right lobe previously demonstrated to be consistent with an adenoma.  Pancreas: Normal.  Spleen: Normal.  Adrenals/Urinary Tract: Normal kidneys and ureters in adrenal glands. The bladder is markedly distended above the umbilicus.  Stomach/Bowel: Normal.  The appendix has been removed.  Vascular/Lymphatic: Scattered calcifications in the abdominal aorta.  Reproductive: 5 cm fibroid in the left side of the uterus. Right ovary is normal. Left ovary is not discretely identified.  Other: No free fluid.  No free air.  Musculoskeletal: Degenerative disc disease at L3-4.  IMPRESSION: Marked distention of the bladder.  Hepatomegaly with hepatic steatosis. Stable adenoma in the right lobe of the liver.  5 cm fibroid in the left side of the uterus.   Electronically Signed   By: Lorriane Shire M.D.   On: 11/24/2014 15:09   Ct Head Wo Contrast  11/24/2014   CLINICAL DATA:  Nystagmus.  Dizzy.  Aggressive behavior.  EXAM: CT HEAD WITHOUT CONTRAST  TECHNIQUE: Contiguous axial images were  obtained from the base of the skull through the vertex without intravenous contrast.  COMPARISON:  None.  FINDINGS: No acute cortical infarct, hemorrhage, or mass lesion is present. The ventricles are of normal size. No significant extra-axial fluid collection is evident. The paranasal sinuses and mastoid air cells are clear. The calvarium is intact.  IMPRESSION: Negative CT of the head.   Electronically Signed   By: San Morelle M.D.   On: 11/24/2014 15:05     CBC  Recent Labs Lab 11/24/14 1300 11/28/14 1433 11/29/14 0508  WBC 9.3 7.4 5.2  HGB 13.7 13.7 11.5*  HCT 40.4 40.6  35.1*  PLT 270 270 229  MCV 96.9 98.1 99.4  MCH 32.9 33.1 32.6  MCHC 33.9 33.7 32.8  RDW 15.5 15.6* 15.8*  LYMPHSABS 3.7 3.3  --   MONOABS 0.6 0.4  --   EOSABS 0.1 0.1  --   BASOSABS 0.0 0.0  --     Chemistries   Recent Labs Lab 11/24/14 1300 11/28/14 1433 11/29/14 0508  NA 142 147* 139  K 3.7 3.6 3.4*  CL 105 113* 107  CO2 23 22 22   GLUCOSE 94 97 82  BUN 9 <5* 7  CREATININE 0.64 0.63 0.56  CALCIUM 9.2 9.4 8.7*  AST 186* 354* 276*  ALT 158* 237* 200*  ALKPHOS 183* 175* 152*  BILITOT 1.0 0.9 1.1   ------------------------------------------------------------------------------------------------------------------ estimated creatinine clearance is 81.3 mL/min (by C-G formula based on Cr of 0.56). ------------------------------------------------------------------------------------------------------------------ No results for input(s): HGBA1C in the last 72 hours. ------------------------------------------------------------------------------------------------------------------ No results for input(s): CHOL, HDL, LDLCALC, TRIG, CHOLHDL, LDLDIRECT in the last 72 hours. ------------------------------------------------------------------------------------------------------------------ No results for input(s): TSH, T4TOTAL, T3FREE, THYROIDAB in the last 72 hours.  Invalid input(s):  FREET3 ------------------------------------------------------------------------------------------------------------------ No results for input(s): VITAMINB12, FOLATE, FERRITIN, TIBC, IRON, RETICCTPCT in the last 72 hours.  Coagulation profile No results for input(s): INR, PROTIME in the last 168 hours.  No results for input(s): DDIMER in the last 72 hours.  Cardiac Enzymes No results for input(s): CKMB, TROPONINI, MYOGLOBIN in the last 168 hours.  Invalid input(s): CK ------------------------------------------------------------------------------------------------------------------ Invalid input(s): POCBNP     Time Spent in minutes   25 minutes   ELGERGAWY, DAWOOD M.D on 11/29/2014 at 1:45 PM  Between 7am to 7pm - Pager - [phone]  After 7pm go to [email or web address] - password [facility]  [facility]   Office  [phone]

## 2014-11-30 DIAGNOSIS — F319 Bipolar disorder, unspecified: Secondary | ICD-10-CM

## 2014-11-30 DIAGNOSIS — T50902A Poisoning by unspecified drugs, medicaments and biological substances, intentional self-harm, initial encounter: Secondary | ICD-10-CM | POA: Diagnosis not present

## 2014-11-30 DIAGNOSIS — F10129 Alcohol abuse with intoxication, unspecified: Secondary | ICD-10-CM | POA: Diagnosis not present

## 2014-11-30 DIAGNOSIS — R45851 Suicidal ideations: Secondary | ICD-10-CM | POA: Diagnosis not present

## 2014-11-30 DIAGNOSIS — F1024 Alcohol dependence with alcohol-induced mood disorder: Secondary | ICD-10-CM | POA: Insufficient documentation

## 2014-11-30 DIAGNOSIS — R7989 Other specified abnormal findings of blood chemistry: Secondary | ICD-10-CM

## 2014-11-30 DIAGNOSIS — I10 Essential (primary) hypertension: Secondary | ICD-10-CM

## 2014-11-30 LAB — HEPATIC FUNCTION PANEL
ALT: 179 U/L — ABNORMAL HIGH (ref 14–54)
AST: 193 U/L — AB (ref 15–41)
Albumin: 3.7 g/dL (ref 3.5–5.0)
Alkaline Phosphatase: 159 U/L — ABNORMAL HIGH (ref 38–126)
BILIRUBIN TOTAL: 1.2 mg/dL (ref 0.3–1.2)
Bilirubin, Direct: 0.2 mg/dL (ref 0.1–0.5)
Indirect Bilirubin: 1 mg/dL — ABNORMAL HIGH (ref 0.3–0.9)
Total Protein: 7.1 g/dL (ref 6.5–8.1)

## 2014-11-30 LAB — BASIC METABOLIC PANEL
ANION GAP: 10 (ref 5–15)
BUN: 7 mg/dL (ref 6–20)
CALCIUM: 9.4 mg/dL (ref 8.9–10.3)
CO2: 25 mmol/L (ref 22–32)
CREATININE: 0.62 mg/dL (ref 0.44–1.00)
Chloride: 105 mmol/L (ref 101–111)
GFR calc Af Amer: >60 mL/min (ref >60)
GFR calc non Af Amer: >60 mL/min (ref >60)
GLUCOSE: 122 mg/dL — AB (ref 65–99)
Potassium: 3.5 mmol/L (ref 3.5–5.1)
SODIUM: 140 mmol/L (ref 135–145)

## 2014-11-30 LAB — HEPATITIS PANEL, ACUTE
HCV Ab: <0.1 s/co ratio (ref 0.0–0.9)
HEP B S AG: NEGATIVE
Hep A IgM: NEGATIVE
Hep B C IgM: NEGATIVE

## 2014-11-30 LAB — MAGNESIUM: Magnesium: 2 mg/dL (ref 1.7–2.4)

## 2014-11-30 LAB — CBC
HCT: 34 % — ABNORMAL LOW (ref 36.0–46.0)
Hemoglobin: 11.4 g/dL — ABNORMAL LOW (ref 12.0–15.0)
MCH: 32.6 pg (ref 26.0–34.0)
MCHC: 33.5 g/dL (ref 30.0–36.0)
MCV: 97.1 fL (ref 78.0–100.0)
PLATELETS: 196 10*3/uL (ref 150–400)
RBC: 3.5 MIL/uL — ABNORMAL LOW (ref 3.87–5.11)
RDW: 15.1 % (ref 11.5–15.5)
WBC: 4.4 10*3/uL (ref 4.0–10.5)

## 2014-11-30 LAB — CLOSTRIDIUM DIFFICILE BY PCR: Toxigenic C. Difficile by PCR: NEGATIVE

## 2014-11-30 MED ORDER — FOLIC ACID 1 MG PO TABS: 1.0000 mg | ORAL_TABLET | Freq: Every day | ORAL | Status: DC

## 2014-11-30 MED ORDER — ADULT MULTIVITAMIN W/MINERALS CH: 1.0000 | ORAL_TABLET | Freq: Every day | ORAL | Status: DC

## 2014-11-30 MED ORDER — THIAMINE HCL 100 MG PO TABS
100.0000 mg | ORAL_TABLET | Freq: Every day | ORAL | Status: DC
Start: 2014-11-30 — End: 2015-01-27

## 2014-11-30 MED ORDER — FAMOTIDINE 20 MG PO TABS
20.0000 mg | ORAL_TABLET | Freq: Two times a day (BID) | ORAL | Status: DC
  Administered 2014-11-30: 20 mg via ORAL
  Filled 2014-11-30 (×2): qty 1

## 2014-11-30 MED ORDER — POTASSIUM CHLORIDE CRYS ER 20 MEQ PO TBCR
40.0000 meq | EXTENDED_RELEASE_TABLET | Freq: Once | ORAL | Status: AC
  Administered 2014-11-30: 40 meq via ORAL
  Filled 2014-11-30: qty 2

## 2014-11-30 NOTE — Clinical Social Work Psych Assess (Signed)
Clinical Social Work Nature conservation officer  Clinical Social Worker:  Ludwig Clarks, [place] Date/Time:  11/30/2014, 10:11 AM Referred By:  Physician Date Referred:  11/29/14 Reason for Referral:  Behavioral Health Issues   Presenting Symptoms/Problems  Presenting Symptoms/Problems(in person's/family's own words):  Patient reports she was not trying to kill herself- "I was feeling hopeless but not suicidal".    Abuse/Neglect/Trauma History  Abuse/Neglect/Trauma History:  Denies History Abuse/Neglect/Trauma History Comments (indicate dates):  denies   Psychiatric History  Psychiatric History:  Outpatient Treatment Psychiatric Medication:  See MAR   Current Mental Health Hospitalizations/Previous Mental Health History:  Reports outpt treatment in past-    Current Provider:    Place and Date:     Current Medications:  See MAR   Previous Inpatient Admission/Date/Reason:  Denies    Emotional Health/Current Symptoms  Suicide/Self Harm: Self-Injurious Behaviors (ex. picking & pinching or carving on skin, chronic runaway, poor judgement), Suicide Attempt in the Past (date/description) Suicide Attempt in Past (date/description):  Denies history but reports "cutting as a teenager"  Other Harmful Behavior (ex. homicidal ideation) (describe):  Cutting as a teenager/etoh abuse   Psychotic/Dissociative Symptoms  Psychotic/Dissociative Symptoms: None Reported Other Psychotic/Dissociative Symptoms:  NA   Attention/Behavioral Symptoms  Attention/Behavioral Symptoms: Within Normal Limits Other Attention/Behavioral Symptoms:      Cognitive Impairment  Cognitive Impairment:  Within Normal Limits Other Cognitive Impairment:      Mood and Adjustment  Mood and Adjustment:  Anxious   Stress, Anxiety, Trauma, Any Recent Loss/Stressor  Stress, Anxiety, Trauma, Any Recent Loss/Stressor: Anxiety Anxiety (frequency):  Current and often/daily  Phobia (specify):      Compulsive Behavior (specify):     Obsessive Behavior (specify):     Other Stress, Anxiety, Trauma, Any Recent Loss/Stressor:  Family stress with daughter   Substance Abuse/Use  Substance Abuse/Use: Current Substance Use SBIRT Completed (please refer for detailed history): No Self-reported Substance Use (last use and frequency):  etoh use/daily-   Urinary Drug Screen Completed: Yes Alcohol Level:      Environment/Housing/Living Arrangement  Environmental/Housing/Living Arrangement: With Family Member Who is in the Home:  43yo daughter  Emergency Contact:      Financial  Financial: Private Insurance   Patient's Strengths and Goals  Patient's Strengths and Goals (patient's own words):  Hopeful, fulltime job with benefits where she has worked for 6 years   Clinical Social Worker's Interpretive Summary  Clinical Social Workers Interpretive Summary:  Patient appears regretful regarding this hospital admission and what led to this stay. She denies feeling suicidal at this time and is interested in seeking help (outpatient or inpatient)- she wants to be discharged home to take care of arrangements for her daughter as well as her self and her job and to then seek treatment. CSW advised her we will seek inpatient options at this time and await further word from Psych MD- she is anxious to see him for further conversation and dc planning.   Disposition  Disposition: Inpatient Referral Made [facility], [place])

## 2014-11-30 NOTE — Discharge Summary (Signed)
Physician Discharge Summary  Stacy Pena OVZ:[id number] DOB: 22-Nov-1965 DOA: 11/28/2014  PCP: Lujean Amel, MD  Admit date: 11/28/2014 Discharge date: 11/30/2014  Time spent: 65 minutes  Recommendations for Outpatient Follow-up:  1. Follow-up with Lujean Amel, MD in 1 week. On follow-up patient will need a comprehensive metabolic profile done to follow-up on electrolytes LFTs and renal function. Patient blood pressure also need to be reassessed at that time. 2. Follow-up with outpatient psychiatry in 1 week. On follow-up patient bipolar will need to be readdressed and medication started accordingly if deemed appropriate per psychiatry.  Discharge Diagnoses:  Principal Problem:   Bipolar 1 disorder Active Problems:   Abdominal pain   GERD (gastroesophageal reflux disease)   Hypertension   Alcoholism   Anxiety   Suicidal ideation   Abnormal liver function test   Alcohol intoxication   Nausea vomiting and diarrhea   Depression   Alcohol dependence with alcohol-induced mood disorder   Discharge Condition: stable and improved  Diet recommendation: Regular   Filed Weights   11/28/14 2343  Weight: 71.2 kg (156 lb 15.5 oz)    History of present illness:  Per Dr Ainsley Spinner is a 49 y.o. female with PMH of alcohol abuse, hypertension, GERD, anxiety, bipolar disorder, history of pancreatitis, who presented with hopeless, suicidal ideation, nausea, vomiting, diarrhea, abdominal pain.  Patient reported that she stopped taking her Depakote for about 2 weeks for no reason. Her symptoms of depression have been worsening in the last week. She has very poor sleep, loss of interest in her daily activity, very low anergy, feeling hopeless. She denied hallucination or delusion. She reported to Ed that she had suicidal ideation, but no specific plan. No homicidal ideations. At time of admisssion, she denied suicidal or homicidal ideation when admitting MD saw patient in emergency room. She  reported that she took two pills of Seroquel 1 day prior to admission, and had been drinking vodka on the morning of admission. She reported that she had an altercation with her daughter and this caused her to want to kill herself.   Patient also reported having nausea, vomiting, diarrhea and mild abdominal pain for about one week. Vomited 3 times on the day of admission, and had 4 watery bowel movements without blood in stool. She did not use antibodies recently. She also had a right upper quadrant abdominal pain which is mild, nonradiating.  In ED, patient was found to have WBC 7.4, temperature normal, tachycardia, electrolytes okay. Acetaminophen level less than 10 at 14:33 and 41:28 PM. Salicylate level less than 4. Patient was admitted to inpatient for further evaluation and treatment.   Hospital Course:  #1 bipolar disorder/suicidal ideation Patient was admitted with suicidal ideation after altercation with her daughter and ongoing alcohol abuse. Patient had stopped taking her Depakote 2 weeks prior to admission and had stated that his symptoms of depression have been worsening. On day of admission patient was drinking with symptoms of hopelessness and suicidal ideation. Patient was admitted placed on telemetry placed on Ativan withdrawal protocol on a bedside sitter was placed for safety. Psychiatric consultation was obtained and was recommended that patient Depakote be discontinued secondary to elevated liver enzymes. It was recommended to hold patient's psychiatric medication secondary to recent intentional overdose of her medication. It was initially recommended per psychiatry that patient will benefit from acute psychiatric hospitalization. Patient was hydrated with IV fluids and was followed patient did not have any withdrawal symptoms. Patient was reassessed by psychiatry on the day  of discharge and patient denied any suicidal or homicidal ideation. Patient appeared to be, cooperative and per  psychiatry had no evidence of psychosis. Patient was relating to receive intensive outpatient psychiatric treatment for management of her bipolar disorder and depression. Psychiatry recommended no psychiatric medication at this time and patient to follow-up to intensive outpatient program for medication management and follow-up. Patient agreed to this. Patient has been cleared by psychiatry for discharge home with close outpatient follow-up. Patient be discharged in stable condition.  #2 nausea vomiting abdominal pain diarrhea Likely secondary to withdrawal versus gastroenteritis. Patient improved clinically and symptoms had resolved by day of discharge. Outpatient follow-up.  #3 alcohol abuse Patient was placed on ativan withdrawal protocol. Patient did not have any DTs during the hospitalization.  #4 gastroesophageal reflux disease Patient was maintained on Pepcid.  #5 transaminitis/elevated LFTs Likely secondary to alcohol-induced hepatitis. Acute hepatitis panel was negative. Patient had recent admission in February 2016 with a similar presentation. CT abdomen and pelvis which was done showed hepatomegaly with hepatic steatosis. Stable adenoma in the right lobe of the liver. 5 cm fibroid in the left side of the uterus. Marked distention of the bladder. Patient was hydrated with IV fluids and LFTs trended down. Patient was counseled on alcohol cessation. Patient will follow-up with PCP as outpatient.  #6 hypertension Stable. Patient's ACE inhibitor was held. Outpatient follow-up.  #7 tobacco abuse Tobacco cessation. Patient was placed on a nicotine patch.  Procedures:  None  Consultations:  Psychiatry: Dr Louretta Shorten 11/29/14  Discharge Exam: Filed Vitals:   11/30/14 0555  BP: 151/85  Pulse: 83  Temp: 98.3 F (36.8 C)  Resp: 18    General: NAD Cardiovascular: RRR Respiratory: CTAB  Discharge Instructions   Discharge Instructions    Diet general    Complete by:  As  directed      Discharge instructions    Complete by:  As directed   STOP DRINKING. Follow up with Lujean Amel, MD on 12/07/14 Follow up with psychiatry as outpatient in 1 week.     Increase activity slowly    Complete by:  As directed           Current Discharge Medication List    START taking these medications   Details  folic acid (FOLVITE) 1 MG tablet Take 1 tablet (1 mg total) by mouth daily.    Multiple Vitamin (MULTIVITAMIN WITH MINERALS) TABS tablet Take 1 tablet by mouth daily.    thiamine 100 MG tablet Take 1 tablet (100 mg total) by mouth daily.      CONTINUE these medications which have NOT CHANGED   Details  LORazepam (ATIVAN) 0.5 MG tablet Take 0.5 mg by mouth 2 (two) times daily as needed for anxiety.     losartan-hydrochlorothiazide (HYZAAR) 50-12.5 MG per tablet Take 1 tablet by mouth daily. Refills: 5    omeprazole (PRILOSEC) 40 MG capsule Take 40 mg by mouth daily.      STOP taking these medications     citalopram (CELEXA) 10 MG tablet      divalproex (DEPAKOTE ER) 500 MG 24 hr tablet      oxyCODONE (OXY IR/ROXICODONE) 5 MG immediate release tablet      promethazine (PHENERGAN) 12.5 MG tablet        No Known Allergies Follow-up Information    Follow up with Lujean Amel, MD.   Specialty:  Family Medicine   Why:  Please follow up with Dr. Dorthy Cooler on Wednesday, June 22nd at 3:30pm  Contact information:   [place] [address] [phone]       Schedule an appointment as soon as possible for a visit in 1 week to follow up.   Why:  F/U WITH PSYCHIATRY       The results of significant diagnostics from this hospitalization (including imaging, microbiology, ancillary and laboratory) are listed below for reference.    Significant Diagnostic Studies: Ct Abdomen Pelvis Wo Contrast  11/24/2014   CLINICAL DATA:  Acute right flank pain.  EXAM: CT ABDOMEN AND PELVIS WITHOUT CONTRAST  TECHNIQUE: Multidetector CT  imaging of the abdomen and pelvis was performed following the standard protocol without IV contrast.  COMPARISON:  CT scan dated 07/27/2014 and abdominal MRI dated 10/09/2014  FINDINGS: Lower chest:  Normal.  Hepatobiliary: Hepatomegaly with extensive hepatic steatosis. The 2.1 cm enhancing lesion in the posterior aspect of the right lobe previously demonstrated to be consistent with an adenoma.  Pancreas: Normal.  Spleen: Normal.  Adrenals/Urinary Tract: Normal kidneys and ureters in adrenal glands. The bladder is markedly distended above the umbilicus.  Stomach/Bowel: Normal.  The appendix has been removed.  Vascular/Lymphatic: Scattered calcifications in the abdominal aorta.  Reproductive: 5 cm fibroid in the left side of the uterus. Right ovary is normal. Left ovary is not discretely identified.  Other: No free fluid.  No free air.  Musculoskeletal: Degenerative disc disease at L3-4.  IMPRESSION: Marked distention of the bladder.  Hepatomegaly with hepatic steatosis. Stable adenoma in the right lobe of the liver.  5 cm fibroid in the left side of the uterus.   Electronically Signed   By: Lorriane Shire M.D.   On: 11/24/2014 15:09   Ct Head Wo Contrast  11/24/2014   CLINICAL DATA:  Nystagmus.  Dizzy.  Aggressive behavior.  EXAM: CT HEAD WITHOUT CONTRAST  TECHNIQUE: Contiguous axial images were obtained from the base of the skull through the vertex without intravenous contrast.  COMPARISON:  None.  FINDINGS: No acute cortical infarct, hemorrhage, or mass lesion is present. The ventricles are of normal size. No significant extra-axial fluid collection is evident. The paranasal sinuses and mastoid air cells are clear. The calvarium is intact.  IMPRESSION: Negative CT of the head.   Electronically Signed   By: San Morelle M.D.   On: 11/24/2014 15:05    Microbiology: Recent Results (from the past 240 hour(s))  Clostridium Difficile by PCR (not at [facility])     Status: None   Collection Time: 11/30/14  12:37 PM  Result Value Ref Range Status   C difficile by pcr NEGATIVE NEGATIVE Final     Labs: Basic Metabolic Panel:  Recent Labs Lab 11/24/14 1300 11/28/14 1433 11/29/14 0508 11/30/14 0545  NA 142 147* 139 140  K 3.7 3.6 3.4* 3.5  CL 105 113* 107 105  CO2 23 22 22 25   GLUCOSE 94 97 82 122*  BUN 9 <5* 7 7  CREATININE 0.64 0.63 0.56 0.62  CALCIUM 9.2 9.4 8.7* 9.4  MG  --   --   --  2.0   Liver Function Tests:  Recent Labs Lab 11/24/14 1300 11/28/14 1433 11/29/14 0508 11/30/14 0545  AST 186* 354* 276* 193*  ALT 158* 237* 200* 179*  ALKPHOS 183* 175* 152* 159*  BILITOT 1.0 0.9 1.1 1.2  PROT 8.4* 8.6* 7.0 7.1  ALBUMIN 4.4 4.6 3.9 3.7    Recent Labs Lab 11/24/14 1300 11/29/14 0508  LIPASE 27 24   No results for  input(s): AMMONIA in the last 168 hours. CBC:  Recent Labs Lab 11/24/14 1300 11/28/14 1433 11/29/14 0508 11/30/14 0545  WBC 9.3 7.4 5.2 4.4  NEUTROABS 4.8 3.4  --   --   HGB 13.7 13.7 11.5* 11.4*  HCT 40.4 40.6 35.1* 34.0*  MCV 96.9 98.1 99.4 97.1  PLT 270 270 229 196   Cardiac Enzymes: No results for input(s): CKTOTAL, CKMB, CKMBINDEX, TROPONINI in the last 168 hours. BNP: BNP (last 3 results) No results for input(s): BNP in the last 8760 hours.  ProBNP (last 3 results) No results for input(s): PROBNP in the last 8760 hours.  CBG: No results for input(s): GLUCAP in the last 168 hours.     SignedIrine Seal MD [facility] 11/30/2014, 4:05 PM
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
## 2014-11-30 NOTE — Progress Notes (Signed)
Stacy Pena to be D/C'd Home per MD order.  Discussed prescriptions and follow up appointments with the patient. Prescriptions given to patient, medication list explained in detail. Pt verbalized understanding.    Medication List    STOP taking these medications        citalopram 10 MG tablet  Commonly known as:  CELEXA     divalproex 500 MG 24 hr tablet  Commonly known as:  DEPAKOTE ER     oxyCODONE 5 MG immediate release tablet  Commonly known as:  Oxy IR/ROXICODONE     promethazine 12.5 MG tablet  Commonly known as:  PHENERGAN      TAKE these medications        folic acid 1 MG tablet  Commonly known as:  FOLVITE  Take 1 tablet (1 mg total) by mouth daily.     LORazepam 0.5 MG tablet  Commonly known as:  ATIVAN  Take 0.5 mg by mouth 2 (two) times daily as needed for anxiety.     losartan-hydrochlorothiazide 50-12.5 MG per tablet  Commonly known as:  HYZAAR  Take 1 tablet by mouth daily.     multivitamin with minerals Tabs tablet  Take 1 tablet by mouth daily.     omeprazole 40 MG capsule  Commonly known as:  PRILOSEC  Take 40 mg by mouth daily.     thiamine 100 MG tablet  Take 1 tablet (100 mg total) by mouth daily.        Filed Vitals:   11/30/14 0555  BP: 151/85  Pulse: 83  Temp: 98.3 F (36.8 C)  Resp: 18    Skin clean, dry and intact without evidence of skin break down, no evidence of skin tears noted. IV catheter discontinued intact. Site without signs and symptoms of complications. Dressing and pressure applied. Pt denies pain at this time. No complaints noted.  An After Visit Summary was printed and given to the patient. Patient escorted via [place], and D/C home via private auto.  [name] 11/30/2014 4:33 PM

## 2014-12-01 LAB — GI PATHOGEN PANEL BY PCR, STOOL
C difficile toxin A/B: NOT DETECTED
CAMPYLOBACTER BY PCR: NOT DETECTED
CRYPTOSPORIDIUM BY PCR: NOT DETECTED
E COLI 0157 BY PCR: NOT DETECTED
E coli (ETEC) LT/ST: NOT DETECTED
E coli (STEC): NOT DETECTED
G LAMBLIA BY PCR: NOT DETECTED
Norovirus GI/GII: NOT DETECTED
ROTAVIRUS A BY PCR: NOT DETECTED
Salmonella by PCR: NOT DETECTED
Shigella by PCR: NOT DETECTED

## 2014-12-07 ENCOUNTER — Encounter (HOSPITAL_COMMUNITY): Payer: Self-pay | Admitting: Internal Medicine

## 2015-01-24 ENCOUNTER — Encounter (HOSPITAL_COMMUNITY): Payer: Self-pay

## 2015-01-24 ENCOUNTER — Emergency Department (HOSPITAL_COMMUNITY)
Admission: EM | Admit: 2015-01-24 | Discharge: 2015-01-25 | Disposition: A | Discharge status: Other Acute Inpatient Hospital | Payer: 59 | Attending: Emergency Medicine | Admitting: Emergency Medicine

## 2015-01-24 DIAGNOSIS — R1011 Right upper quadrant pain: Secondary | ICD-10-CM

## 2015-01-24 DIAGNOSIS — F10129 Alcohol abuse with intoxication, unspecified: Secondary | ICD-10-CM | POA: Diagnosis not present

## 2015-01-24 DIAGNOSIS — Z91148 Patient's other noncompliance with medication regimen for other reason: Secondary | ICD-10-CM

## 2015-01-24 DIAGNOSIS — Z9119 Patient's noncompliance with other medical treatment and regimen: Secondary | ICD-10-CM | POA: Diagnosis not present

## 2015-01-24 DIAGNOSIS — Z720 Tobacco use: Secondary | ICD-10-CM | POA: Diagnosis not present

## 2015-01-24 DIAGNOSIS — Z9114 Patient's other noncompliance with medication regimen: Secondary | ICD-10-CM

## 2015-01-24 DIAGNOSIS — F10929 Alcohol use, unspecified with intoxication, unspecified: Secondary | ICD-10-CM

## 2015-01-24 DIAGNOSIS — R45851 Suicidal ideations: Secondary | ICD-10-CM | POA: Diagnosis present

## 2015-01-24 DIAGNOSIS — I10 Essential (primary) hypertension: Secondary | ICD-10-CM | POA: Insufficient documentation

## 2015-01-24 DIAGNOSIS — Z8719 Personal history of other diseases of the digestive system: Secondary | ICD-10-CM | POA: Insufficient documentation

## 2015-01-24 LAB — CBC WITH DIFFERENTIAL/PLATELET
BASOS ABS: 0 10*3/uL (ref 0.0–0.1)
BASOS PCT: 0 % (ref 0–1)
Eosinophils Absolute: 0.3 10*3/uL (ref 0.0–0.7)
Eosinophils Relative: 3 % (ref 0–5)
HCT: 42.9 % (ref 36.0–46.0)
Hemoglobin: 14.5 g/dL (ref 12.0–15.0)
Lymphocytes Relative: 38 % (ref 12–46)
Lymphs Abs: 3.5 10*3/uL (ref 0.7–4.0)
MCH: 33.2 pg (ref 26.0–34.0)
MCHC: 33.8 g/dL (ref 30.0–36.0)
MCV: 98.2 fL (ref 78.0–100.0)
Monocytes Absolute: 0.5 10*3/uL (ref 0.1–1.0)
Monocytes Relative: 5 % (ref 3–12)
NEUTROS ABS: 5 10*3/uL (ref 1.7–7.7)
NEUTROS PCT: 54 % (ref 43–77)
PLATELETS: 302 10*3/uL (ref 150–400)
RBC: 4.37 MIL/uL (ref 3.87–5.11)
RDW: 14.3 % (ref 11.5–15.5)
WBC: 9.3 10*3/uL (ref 4.0–10.5)

## 2015-01-24 LAB — COMPREHENSIVE METABOLIC PANEL
ALT: 192 U/L — ABNORMAL HIGH (ref 14–54)
AST: 271 U/L — AB (ref 15–41)
Albumin: 4.8 g/dL (ref 3.5–5.0)
Alkaline Phosphatase: 185 U/L — ABNORMAL HIGH (ref 38–126)
Anion gap: 17 — ABNORMAL HIGH (ref 5–15)
BILIRUBIN TOTAL: 0.8 mg/dL (ref 0.3–1.2)
BUN: 6 mg/dL (ref 6–20)
CHLORIDE: 107 mmol/L (ref 101–111)
CO2: 18 mmol/L — AB (ref 22–32)
CREATININE: 0.46 mg/dL (ref 0.44–1.00)
Calcium: 9.3 mg/dL (ref 8.9–10.3)
GFR calc non Af Amer: >60 mL/min (ref >60)
Glucose, Bld: 96 mg/dL (ref 65–99)
Potassium: 4 mmol/L (ref 3.5–5.1)
Sodium: 142 mmol/L (ref 135–145)
TOTAL PROTEIN: 9.1 g/dL — AB (ref 6.5–8.1)

## 2015-01-24 LAB — URINE MICROSCOPIC-ADD ON

## 2015-01-24 LAB — URINALYSIS, ROUTINE W REFLEX MICROSCOPIC
Bilirubin Urine: NEGATIVE
GLUCOSE, UA: NEGATIVE mg/dL
HGB URINE DIPSTICK: NEGATIVE
Ketones, ur: NEGATIVE mg/dL
Nitrite: NEGATIVE
Protein, ur: NEGATIVE mg/dL
Specific Gravity, Urine: 1.005 (ref 1.005–1.030)
Urobilinogen, UA: 0.2 mg/dL (ref 0.0–1.0)
pH: 5 (ref 5.0–8.0)

## 2015-01-24 LAB — ETHANOL: ALCOHOL ETHYL (B): 241 mg/dL — AB (ref <5)

## 2015-01-24 MED ORDER — LORAZEPAM 1 MG PO TABS
1.0000 mg | ORAL_TABLET | Freq: Once | ORAL | Status: AC
  Administered 2015-01-24: 1 mg via ORAL
  Filled 2015-01-24: qty 1

## 2015-01-24 MED ORDER — ZOLPIDEM TARTRATE 5 MG PO TABS: 5.0000 mg | ORAL_TABLET | Freq: Every evening | ORAL | Status: DC | PRN

## 2015-01-24 MED ORDER — IBUPROFEN 200 MG PO TABS
400.0000 mg | ORAL_TABLET | Freq: Once | ORAL | Status: AC
  Administered 2015-01-24: 400 mg via ORAL
  Filled 2015-01-24: qty 2

## 2015-01-24 MED ORDER — ALUM & MAG HYDROXIDE-SIMETH 200-200-20 MG/5ML PO SUSP: 30.0000 mL | ORAL | Status: DC | PRN

## 2015-01-24 MED ORDER — NICOTINE 21 MG/24HR TD PT24: 21.0000 mg | MEDICATED_PATCH | Freq: Every day | TRANSDERMAL | Status: DC | PRN

## 2015-01-24 MED ORDER — ONDANSETRON HCL 4 MG PO TABS
4.0000 mg | ORAL_TABLET | Freq: Three times a day (TID) | ORAL | Status: DC | PRN
  Administered 2015-01-24: 4 mg via ORAL
  Filled 2015-01-24: qty 1

## 2015-01-24 MED ORDER — IBUPROFEN 200 MG PO TABS: 600.0000 mg | ORAL_TABLET | Freq: Three times a day (TID) | ORAL | Status: DC | PRN

## 2015-01-24 MED ORDER — ACETAMINOPHEN 325 MG PO TABS: 650.0000 mg | ORAL_TABLET | ORAL | Status: DC | PRN

## 2015-01-24 NOTE — ED Notes (Addendum)
Upon introducing self to pt pt reports 6/10 for 6 months related to lesion on liver. Pt reports here for ETOH detox; last drink 0900 this am amount pint of liquor. Pt continues to denies SI/HI at present time.   Upon CIWA assessment pt reports "vomiting all morning;" no active vomiting, dry heaves, or objective sign of nausea noted at present time. Pt reports severe tremor; not visible on assessment. Pt reports blurred vision; pt sitting in hall bed with absence of disturbance of light.

## 2015-01-24 NOTE — ED Notes (Signed)
Pt presents with c/o request for detox from ETOH. Pt reports she drinks daily, last drink approx 20 minutes ago. Pt also reports she uses marijuana, no other drugs. Pt denies SI/HI, calm and cooperative at this time.

## 2015-01-24 NOTE — Consult Note (Addendum)
Reginold Agent, NP recommends inpatient treatment. Patient accepted to [facility] room 300-2 after 9:30pm. Support paperwork completed. Nursing report [phone].

## 2015-01-24 NOTE — ED Notes (Addendum)
D: Pt is a 49 y/o Hispanic female admitted to [facility] requesting help with ETOH detox and Depression. Pt denied SI, HI and AVH on arrival to [facility]. Pt presents with flat affect and depressed mood, also stated she was anxious as well. Per assessment note and as confirmed by pt she's been drinking since age 25 which has increased within the last 10 years 1 fifth Vodka daily. Last reported drinking episode was this AM and stated she was vomiting earlier prior to admission but is ok at time of assessment. Pt is ambulatory with a steady gait. A: Motrin administered as ordered for c/o right side pain--"long time from my drinking". Emotional support and availability provided. Sandwich and ginger ale given. Pt encouraged to voice concerns. Safety maintained on Q 15 minutes checks as ordered. R: Pt receptive to care. Resting in bed watching tv at present. Will continue to monitor for stabilization.
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
## 2015-01-24 NOTE — BH Assessment (Incomplete)
Assessment Note  Stacy Pena is an 49 y.o. female. Patient presents to [facility]  Axis I: {psych axis 1:31909} Axis II: {psych axis 2:31910} Axis III:  Past Medical History  Diagnosis Date  . Bipolar 1 disorder   . Hypertension   . Alcoholism   . Pancreatitis 07/2014  . GERD (gastroesophageal reflux disease)   . Depression    Axis IV: {psych axis iv:31915} Axis V: {psych axis v score:31919}  Past Medical History:  Past Medical History  Diagnosis Date  . Bipolar 1 disorder   . Hypertension   . Alcoholism   . Pancreatitis 07/2014  . GERD (gastroesophageal reflux disease)   . Depression     Past Surgical History  Procedure Laterality Date  . Ovarian cyst removal    . Appendectomy    . Wisdom tooth extraction      Family History:  Family History  Problem Relation Age of Onset  . Diabetes Father   . Breast cancer Mother     Social History:  reports that she has been smoking Cigarettes.  She has a 2.5 pack-year smoking history. She has never used smokeless tobacco. She reports that she drinks alcohol. She reports that she does not use illicit drugs.  Additional Social History:  Alcohol / Drug Use Pain Medications: SEE MAR Prescriptions: SEE MAR Over the Counter: SEE MAR History of alcohol / drug use?: Yes Substance #1 Name of Substance 1: Alcohol  1 - Age of First Use: 49 yrs old  1 - Amount (size/oz): 1 pint of vodka 1 - Frequency: daily  1 - Duration: on-going for the past 10 years  1 - Last Use / Amount: this morning; pt reports drinking 1 pint of vodka  CIWA: CIWA-Ar BP: (!) 151/85 mmHg Pulse Rate: 83 Nausea and Vomiting: no nausea and no vomiting Tactile Disturbances: very mild itching, pins and needles, burning or numbness Tremor: no tremor Auditory Disturbances: not present Paroxysmal Sweats: no sweat visible Visual Disturbances: not present Anxiety: moderately anxious, or guarded, so anxiety is inferred Headache, Fullness in Head: very  mild Agitation: normal activity Orientation and Clouding of Sensorium: oriented and can do serial additions CIWA-Ar Total: 6 COWS:    Allergies: No Known Allergies  Home Medications:  No prescriptions prior to admission    OB/GYN Status:  No LMP recorded (within months). Patient is not currently having periods (Reason: Perimenopausal).  General Assessment Data [facility] of Assessment: [facility] TTS Assessment: In system Is this a Tele or Face-to-Face Assessment?: Face-to-Face Is this an Initial Assessment or a Re-assessment for this encounter?: Initial Assessment Marital status: Single Maiden name:  (n/a) Is patient pregnant?: No Pregnancy Status: No Living Arrangements: Children Can pt return to current living arrangement?: No Admission Status: Voluntary Is patient capable of signing voluntary admission?: No Referral Source: Self/Family/Friend Insurance type:  Secretary/administrator)  Medical Screening Exam ([place]) Medical Exam completed: No  Crisis Care Plan Living Arrangements: Children Name of Psychiatrist:  (No psychiatrist ) Name of Therapist:  (No therapist )  Education Status Is patient currently in school?: No  Risk to self with the past 6 months Suicidal Ideation: Yes-Currently Present Has patient been a risk to self within the past 6 months prior to admission? : Yes Suicidal Intent: No Has patient had any suicidal intent within the past 6 months prior to admission? : No Is patient at risk for suicide?: Yes Suicidal Plan?: No Has patient had any suicidal plan within the past 6 months prior  to admission? : No (pt denies; previous history indicates a suicide attempt 6/16) Access to Means: Yes Specify Access to Suicidal Means:  (access to medications) What has been your use of drugs/alcohol within the last 12 months?:  (patient reports heavy alcohol use ) Previous Attempts/Gestures: No How many times?:  (0) Other Self Harm Risks:  (n/a) Triggers for  Past Attempts: Other (Comment) (pt reports ) Intentional Self Injurious Behavior: None Family Suicide History: No Recent stressful life event(s): Other (Comment) ("My alcoholism") Persecutory voices/beliefs?: No Depression: Yes Depression Symptoms: Feeling angry/irritable, Feeling worthless/self pity, Loss of interest in usual pleasures, Guilt, Fatigue, Isolating, Tearfulness, Insomnia, Despondent Substance abuse history and/or treatment for substance abuse?: No Suicide prevention information given to non-admitted patients: Not applicable  Risk to Others within the past 6 months Homicidal Ideation: No Does patient have any lifetime risk of violence toward others beyond the six months prior to admission? : No Thoughts of Harm to Others: No Current Homicidal Intent: No Current Homicidal Plan: No Access to Homicidal Means: No Identified Victim:  (n/a) History of harm to others?: No Assessment of Violence: None Noted Violent Behavior Description:  (Patient is calm and cooperative ) Does patient have access to weapons?: No Criminal Charges Pending?: No Does patient have a court date: No Is patient on probation?: No  Psychosis Hallucinations: None noted Delusions: None noted  Mental Status Report Appearance/Hygiene: Disheveled Eye Contact: Good Motor Activity: Freedom of movement Speech: Logical/coherent Level of Consciousness: Alert Mood: Depressed Affect: Appropriate to circumstance Anxiety Level: None Thought Processes: Coherent, Relevant Judgement: Impaired Orientation: Person, Place, Situation, Time Obsessive Compulsive Thoughts/Behaviors: None  Cognitive Functioning Concentration: Decreased Memory: Recent Intact, Remote Intact IQ: Average Insight: Poor Impulse Control: Fair Appetite: Poor Weight Loss:  (none reported) Weight Gain:  (n/a) Sleep: Decreased Vegetative Symptoms: None  ADLScreening [facility] Assessment Services) Patient's cognitive ability adequate to  safely complete daily activities?: Yes Patient able to express need for assistance with ADLs?: Yes Independently performs ADLs?: Yes (appropriate for developmental age)  Prior Inpatient Therapy Prior Inpatient Therapy: Yes Prior Therapy Dates:  (pt reports 10 yrs ago; prior notes indicate 2 weeks ago ) Prior Therapy Facilty/Provider(s):  (facilty in [place] ) Reason for Treatment:  (n/a)  Prior Outpatient Therapy Prior Outpatient Therapy: No Prior Therapy Dates:  (n/a) Prior Therapy Facilty/Provider(s):  (n/a) Reason for Treatment:  (n/a) Does patient have an ACCT team?: No Does patient have Intensive In-House Services?  : No Does patient have [facility] services? : No Does patient have P4CC services?: No  ADL Screening (condition at time of admission) Patient's cognitive ability adequate to safely complete daily activities?: Yes Is the patient deaf or have difficulty hearing?: No Does the patient have difficulty seeing, even when wearing glasses/contacts?: No Does the patient have difficulty concentrating, remembering, or making decisions?: Yes Patient able to express need for assistance with ADLs?: Yes Does the patient have difficulty dressing or bathing?: No Independently performs ADLs?: Yes (appropriate for developmental age) Does the patient have difficulty walking or climbing stairs?: No Weakness of Legs: None Weakness of Arms/Hands: None  Home Assistive Devices/Equipment Home Assistive Devices/Equipment: None  Therapy Consults (therapy consults require a physician order) PT Evaluation Needed: No OT Evalulation Needed: No SLP Evaluation Needed: No Abuse/Neglect Assessment (Assessment to be complete while patient is alone) Physical Abuse: Denies Verbal Abuse: Denies Sexual Abuse: Denies Exploitation of patient/patient's resources: Denies Self-Neglect: Denies Values / Beliefs Cultural Requests During Hospitalization: None Spiritual Requests During Hospitalization:  None Consults Spiritual Care  Consult Needed: No Social Work Consult Needed: Yes (Comment) Regulatory affairs officer (For Healthcare) Does patient have an advance directive?: No Would patient like information on creating an advanced directive?: No - patient declined information Nutrition Screen- [facility] Patient's home diet: Regular Has the patient recently lost weight without trying?: No Has the patient been eating poorly because of a decreased appetite?: No Malnutrition Screening Tool Score: 0  Additional Information 1:1 In Past 12 Months?: No CIRT Risk: No Elopement Risk: No Does patient have medical clearance?: Yes     Disposition:  Disposition Initial Assessment Completed for this Encounter: Yes Disposition of Patient: Inpatient treatment program (Patient accepted to [facility]; Room 300-2 after 9:30pm) Type of inpatient treatment program: Adult (Patient accepted to [facility] by Dr. Darleene Cleaver and Reginold Agent, NP)  On Site Evaluation by:   Reviewed with Physician:    Waldon Merl [facility] 01/24/2015 5:32 PM

## 2015-01-24 NOTE — ED Provider Notes (Signed)
CSN: [id number]     Arrival date & time 01/24/15  1354 History   First MD Initiated Contact with Patient 01/24/15 1505     Chief Complaint  Patient presents with  . Detox    . Suicidal     (Consider location/radiation/quality/duration/timing/severity/associated sxs/prior Treatment) HPI   Stacy Pena is a 48 y.o. female who presents for evaluation of requests for help with alcohol "detoxification". She also states that she is not taking her bipolar medications. She also is concerned about a liver abnormality which is causing her pain in her upper abdomen. She was admitted with complications of alcohol use, June 2016. She was referred to outpatient psychiatry, but did not do that. Instead she went to [place] and "went into rehabilitation". She states that she was there for 2 weeks, but that she started drinking at the facility, after 1 day. She also states that there were "a lot of drugs that". She states that she went to [place] because of a "relationship", which has since failed. She denies use of illegal drugs. She is not taking her prescribed Depakote or Seroquel. She drinks 1 pint of alcohol each day. She lives with her daughter who is here with her, and seems supportive. The patient states she "went back to work[employer], after being out of work for 3 months. She states that she had stopped work because she was "too weak and tired". She denies being told to leave her job by her employer, because of drinking. She is not invasive historian, not offering information, requiring repeated prompting to determine what her concerns are. She states that she was told to have her liver was imaged in 2 months, but she is not satisfied with that method for follow-up. She is requesting "pain medicine" for her abdominal pain. She denies fever, chills, cough, shortness of breath, chest pain, focal weakness or paresthesia.  Past Medical History  Diagnosis Date  . Bipolar 1 disorder   . Hypertension   .  Alcoholism   . Pancreatitis 07/2014  . GERD (gastroesophageal reflux disease)   . Depression    Past Surgical History  Procedure Laterality Date  . Ovarian cyst removal    . Appendectomy    . Wisdom tooth extraction     Family History  Problem Relation Age of Onset  . Diabetes Father   . Breast cancer Mother    History  Substance Use Topics  . Smoking status: Current Every Day Smoker -- 0.25 packs/day for 10 years    Types: Cigarettes  . Smokeless tobacco: Never Used  . Alcohol Use: Yes     Comment: heavily   OB History    No data available     Review of Systems  All other systems reviewed and are negative.     Allergies  Review of patient's allergies indicates no known allergies.  Home Medications   Prior to Admission medications   Medication Sig Start Date End Date Taking? Authorizing Provider  folic acid (FOLVITE) 1 MG tablet Take 1 tablet (1 mg total) by mouth daily. Patient not taking: Reported on 01/24/2015 11/30/14   Eugenie Filler, MD  Multiple Vitamin (MULTIVITAMIN WITH MINERALS) TABS tablet Take 1 tablet by mouth daily. Patient not taking: Reported on 01/24/2015 11/30/14   Eugenie Filler, MD  thiamine 100 MG tablet Take 1 tablet (100 mg total) by mouth daily. Patient not taking: Reported on 01/24/2015 11/30/14   Eugenie Filler, MD   BP 154/79 mmHg  Pulse 96  Temp(Src) 98.6 F (37 C) (Oral)  Resp 18  SpO2 96% Physical Exam  Constitutional: She is oriented to person, place, and time. She appears well-developed and well-nourished.  HENT:  Head: Normocephalic and atraumatic.  Right Ear: External ear normal.  Left Ear: External ear normal.  Mouth/Throat: Oropharynx is clear and moist.  Eyes: Conjunctivae and EOM are normal. Pupils are equal, round, and reactive to light.  Neck: Normal range of motion and phonation normal. Neck supple.  Cardiovascular: Normal rate, regular rhythm and normal heart sounds.   Pulmonary/Chest: Effort normal and breath  sounds normal. She exhibits no bony tenderness.  Abdominal: Soft. She exhibits no mass. There is tenderness (right upper quadrant, epigastric, mild.). There is no rebound and no guarding.  Musculoskeletal: Normal range of motion.  Neurological: She is alert and oriented to person, place, and time. No cranial nerve deficit or sensory deficit. She exhibits normal muscle tone. Coordination normal.  Mildly depressed responsiveness, and dysarthria consistent with acute alcohol intoxication.  Skin: Skin is warm, dry and intact.  Psychiatric: Her behavior is normal.  She appears depressed.  Nursing note and vitals reviewed.   ED Course  Procedures (including critical care time)  Medications  acetaminophen (TYLENOL) tablet 650 mg (not administered)  ibuprofen (ADVIL,MOTRIN) tablet 600 mg (not administered)  zolpidem (AMBIEN) tablet 5 mg (not administered)  nicotine (NICODERM CQ - dosed in mg/24 hours) patch 21 mg (not administered)  ondansetron (ZOFRAN) tablet 4 mg (not administered)  alum & mag hydroxide-simeth (MAALOX/MYLANTA) 200-200-20 MG/5ML suspension 30 mL (not administered)  ibuprofen (ADVIL,MOTRIN) tablet 400 mg (400 mg Oral Given 01/24/15 1729)    Patient Vitals for the past 24 hrs:  BP Temp Temp src Pulse Resp SpO2  01/24/15 1555 154/79 mmHg - - 96 - -  01/24/15 1554 154/79 mmHg - - 96 18 96 %  01/24/15 1405 151/88 mmHg 98.6 F (37 C) Oral 108 18 96 %   TTS consult  4:15 PM Reevaluation with update and discussion. After initial assessment and treatment, an updated evaluation reveals the patient reported to nursing that she was not suicidal. She also has a new complaint of vomiting at this time. The vomiting was reported to be earlier today. Adin Laker L   17:40- she is medically cleared for treatment in a psychiatric facility. Urine culture has been ordered and is pending. Will not treat for UTI at this time since she does not have complaints of a urinary tract  infection.  Labs Review Labs Reviewed  COMPREHENSIVE METABOLIC PANEL - Abnormal; Notable for the following:    CO2 18 (*)    Total Protein 9.1 (*)    AST 271 (*)    ALT 192 (*)    Alkaline Phosphatase 185 (*)    Anion gap 17 (*)    All other components within normal limits  ETHANOL - Abnormal; Notable for the following:    Alcohol, Ethyl (B) 241 (*)    All other components within normal limits  URINALYSIS, ROUTINE W REFLEX MICROSCOPIC (NOT AT [facility]) - Abnormal; Notable for the following:    APPearance CLOUDY (*)    Leukocytes, UA MODERATE (*)    All other components within normal limits  URINE MICROSCOPIC-ADD ON - Abnormal; Notable for the following:    Bacteria, UA FEW (*)    All other components within normal limits  URINE CULTURE  CBC WITH DIFFERENTIAL/PLATELET    Imaging Review No results found.   EKG Interpretation None  MDM   Final diagnoses:  Suicidal ideation  Alcohol intoxication, with unspecified complication  Right upper quadrant pain  Noncompliance with medications    Alcoholism with alcohol intoxication, and request for "detox". Patient is also suicidal. The patient is a very difficult historian. She will require reassessment, as well as evaluation by psychiatry, after she begins to be more sober. Mild transaminitis secondary alcohol abuse. Doubt acute liver failure or complication from known liver lesion.  Nursing Notes Reviewed/ Care Coordinated, and agree without changes. Applicable Imaging Reviewed.  Interpretation of Laboratory Data incorporated into ED treatment  Plan- as per TTS, in conjunction with oncoming provider team    Daleen Bo, MD 01/24/15 1752

## 2015-01-24 NOTE — ED Notes (Signed)
Pt reported to EDP that she is SI w/o a plan and Bipolar.  Sts she is noncompliant w/ medications.

## 2015-01-24 NOTE — ED Notes (Signed)
[facility] contacted for transport to [facility]. Milta Deiters staff member of [facility] reported that they were in the middle of shift change and it would be 2340 before someone would arrive.

## 2015-01-24 NOTE — ED Notes (Signed)
Patient denies SI. HI and AVH at this time. Patient calm and cooperative at this time. Plan of care discussed. Patient voices no complaints at this time. Encouragement and support provided and safety maintain. Q 15 min safety checks remain in place.

## 2015-01-25 ENCOUNTER — Inpatient Hospital Stay (HOSPITAL_COMMUNITY)
Admission: AD | Admit: 2015-01-25 | Discharge: 2015-01-27 | DRG: 897 | Disposition: A | Discharge status: Other Acute Inpatient Hospital | Payer: 59 | Source: Intra-hospital | Attending: Psychiatry | Admitting: Psychiatry

## 2015-01-25 ENCOUNTER — Encounter (HOSPITAL_COMMUNITY): Payer: Self-pay | Admitting: Nurse Practitioner

## 2015-01-25 DIAGNOSIS — F1024 Alcohol dependence with alcohol-induced mood disorder: Principal | ICD-10-CM | POA: Diagnosis present

## 2015-01-25 DIAGNOSIS — F319 Bipolar disorder, unspecified: Secondary | ICD-10-CM

## 2015-01-25 DIAGNOSIS — G4700 Insomnia, unspecified: Secondary | ICD-10-CM | POA: Diagnosis present

## 2015-01-25 DIAGNOSIS — F17210 Nicotine dependence, cigarettes, uncomplicated: Secondary | ICD-10-CM | POA: Diagnosis present

## 2015-01-25 DIAGNOSIS — F329 Major depressive disorder, single episode, unspecified: Secondary | ICD-10-CM | POA: Diagnosis present

## 2015-01-25 DIAGNOSIS — F10239 Alcohol dependence with withdrawal, unspecified: Secondary | ICD-10-CM | POA: Diagnosis present

## 2015-01-25 DIAGNOSIS — F1020 Alcohol dependence, uncomplicated: Secondary | ICD-10-CM | POA: Diagnosis present

## 2015-01-25 DIAGNOSIS — F3162 Bipolar disorder, current episode mixed, moderate: Secondary | ICD-10-CM | POA: Diagnosis present

## 2015-01-25 DIAGNOSIS — Y908 Blood alcohol level of 240 mg/100 ml or more: Secondary | ICD-10-CM | POA: Diagnosis present

## 2015-01-25 LAB — LIPASE, BLOOD: Lipase: 16 U/L — ABNORMAL LOW (ref 22–51)

## 2015-01-25 LAB — AMYLASE: Amylase: 87 U/L (ref 28–100)

## 2015-01-25 MED ORDER — ACETAMINOPHEN 325 MG PO TABS: 650.0000 mg | ORAL_TABLET | Freq: Four times a day (QID) | ORAL | Status: DC | PRN

## 2015-01-25 MED ORDER — LOPERAMIDE HCL 2 MG PO CAPS: 2.0000 mg | ORAL_CAPSULE | ORAL | Status: DC | PRN

## 2015-01-25 MED ORDER — FOLIC ACID 1 MG PO TABS
1.0000 mg | ORAL_TABLET | Freq: Every day | ORAL | Status: DC
  Administered 2015-01-25 – 2015-01-27 (×3): 1 mg via ORAL
  Filled 2015-01-25 (×5): qty 1

## 2015-01-25 MED ORDER — ONDANSETRON 4 MG PO TBDP
4.0000 mg | ORAL_TABLET | Freq: Four times a day (QID) | ORAL | Status: DC | PRN
  Administered 2015-01-27 (×2): 4 mg via ORAL
  Filled 2015-01-25: qty 1

## 2015-01-25 MED ORDER — LORAZEPAM 1 MG PO TABS
1.0000 mg | ORAL_TABLET | Freq: Four times a day (QID) | ORAL | Status: AC
  Administered 2015-01-25 – 2015-01-26 (×6): 1 mg via ORAL
  Filled 2015-01-25 (×6): qty 1

## 2015-01-25 MED ORDER — TRAZODONE HCL 50 MG PO TABS
50.0000 mg | ORAL_TABLET | Freq: Every evening | ORAL | Status: DC | PRN
  Administered 2015-01-25 – 2015-01-26 (×2): 50 mg via ORAL
  Filled 2015-01-25 (×7): qty 1

## 2015-01-25 MED ORDER — MAGNESIUM HYDROXIDE 400 MG/5ML PO SUSP: 30.0000 mL | Freq: Every day | ORAL | Status: DC | PRN

## 2015-01-25 MED ORDER — HYDROXYZINE HCL 25 MG PO TABS
25.0000 mg | ORAL_TABLET | Freq: Four times a day (QID) | ORAL | Status: DC | PRN
  Administered 2015-01-25 – 2015-01-27 (×4): 25 mg via ORAL
  Filled 2015-01-25 (×4): qty 1

## 2015-01-25 MED ORDER — LORAZEPAM 1 MG PO TABS
1.0000 mg | ORAL_TABLET | Freq: Three times a day (TID) | ORAL | Status: DC
  Administered 2015-01-26 – 2015-01-27 (×2): 1 mg via ORAL
  Filled 2015-01-25 (×2): qty 1

## 2015-01-25 MED ORDER — NICOTINE 21 MG/24HR TD PT24
21.0000 mg | MEDICATED_PATCH | Freq: Every day | TRANSDERMAL | Status: DC
  Administered 2015-01-25 – 2015-01-27 (×3): 21 mg via TRANSDERMAL
  Filled 2015-01-25 (×5): qty 1

## 2015-01-25 MED ORDER — FAMOTIDINE 20 MG PO TABS
20.0000 mg | ORAL_TABLET | Freq: Two times a day (BID) | ORAL | Status: DC
  Administered 2015-01-25 – 2015-01-27 (×5): 20 mg via ORAL
  Filled 2015-01-25 (×8): qty 1

## 2015-01-25 MED ORDER — LORAZEPAM 1 MG PO TABS
1.0000 mg | ORAL_TABLET | Freq: Four times a day (QID) | ORAL | Status: DC | PRN
  Administered 2015-01-25 – 2015-01-26 (×3): 1 mg via ORAL
  Filled 2015-01-25 (×3): qty 1

## 2015-01-25 MED ORDER — ADULT MULTIVITAMIN W/MINERALS CH
1.0000 | ORAL_TABLET | Freq: Every day | ORAL | Status: DC
  Administered 2015-01-25 – 2015-01-27 (×3): 1 via ORAL
  Filled 2015-01-25 (×5): qty 1

## 2015-01-25 MED ORDER — BOOST / RESOURCE BREEZE PO LIQD
1.0000 | Freq: Three times a day (TID) | ORAL | Status: DC
  Administered 2015-01-25 – 2015-01-26 (×3): 1 via ORAL
  Filled 2015-01-25 (×9): qty 1

## 2015-01-25 MED ORDER — NYSTATIN 100000 UNIT/GM EX POWD
Freq: Two times a day (BID) | CUTANEOUS | Status: DC
  Administered 2015-01-25 – 2015-01-27 (×2): via TOPICAL
  Filled 2015-01-25 (×2): qty 15

## 2015-01-25 MED ORDER — VITAMIN B-1 100 MG PO TABS
100.0000 mg | ORAL_TABLET | Freq: Every day | ORAL | Status: DC
  Administered 2015-01-25 – 2015-01-27 (×3): 100 mg via ORAL
  Filled 2015-01-25 (×5): qty 1

## 2015-01-25 MED ORDER — ALUM & MAG HYDROXIDE-SIMETH 200-200-20 MG/5ML PO SUSP: 30.0000 mL | ORAL | Status: DC | PRN

## 2015-01-25 MED ORDER — LORAZEPAM 1 MG PO TABS: 1.0000 mg | ORAL_TABLET | Freq: Two times a day (BID) | ORAL | Status: DC

## 2015-01-25 MED ORDER — LORAZEPAM 1 MG PO TABS: 1.0000 mg | ORAL_TABLET | Freq: Every day | ORAL | Status: DC

## 2015-01-25 NOTE — Progress Notes (Addendum)
NUTRITION ASSESSMENT  Pt identified as at risk on the Malnutrition Screen Tool  INTERVENTION: 1. Educated patient on the importance of nutrition and encouraged intake of food and beverages. 2. Discussed weight goals. 3. Supplements: Boost Breeze po TID, each supplement provides 250 kcal and 9 grams of protein  NUTRITION DIAGNOSIS: Unintentional weight loss related to sub-optimal intake as evidenced by pt report.   Goal: Pt to meet >/= 90% of their estimated nutrition needs.  Monitor:  PO intake  Assessment:  Pt admitted with history of ETOH abuse. Per RN note, pt has been drinking 1 pint of vodka daily, in addition to wine and beer. Pt has been experiencing abdominal pain, nausea and has had 10 lb weight loss over the last 3 months.   Pt would benefit from a nutritional supplement. RD to order Boost Breeze TID.  Height: Ht Readings from Last 1 Encounters:  01/25/15 5' 2.6" (1.59 m)    Weight: Wt Readings from Last 1 Encounters:  01/25/15 159 lb (72.122 kg)    Weight Hx: Wt Readings from Last 10 Encounters:  01/25/15 159 lb (72.122 kg)  11/28/14 156 lb 15.5 oz (71.2 kg)  10/09/14 156 lb (70.761 kg)  07/27/14 156 lb 12 oz (71.1 kg)    BMI:  Body mass index is 28.53 kg/(m^2). Pt meets criteria for overweight based on current BMI.  Estimated Nutritional Needs: Kcal: 25-30 kcal/kg Protein: > 1 gram protein/kg Fluid: 1 ml/kcal  Diet Order: Diet regular Room service appropriate?: Yes; Fluid consistency:: Thin Pt is also offered choice of unit snacks mid-morning and mid-afternoon.  Pt is eating as desired.   Lab results and medications reviewed.   Stacy Bibles, MS, RD, LDN Pager: [phone] After Hours Pager: [phone]

## 2015-01-25 NOTE — Clinical Social Work Note (Signed)
[facility] referrals sent per pt request.  Maxie Better, [place] Worker 01/25/2015 3:51 PM

## 2015-01-25 NOTE — Progress Notes (Signed)
Recreation Therapy Notes  Date: 08.10.16 Time: 930 am Location: 300 Hall Group Room  Group Topic: Stress Management  Goal Area(s) Addresses:  Patient will verbalize importance of using healthy stress management.  Patient will identify positive emotions associated with healthy stress management.   Behavioral Response:  Engaged  Intervention: Stress Management  Activity : Progressive Muscle Relaxation. LRT will introduce and instruct patients on the stress management technique of progressive muscle relaxation. Patients were asked to follow a long with a script read a loud by LRT to participate in the stress management technique of progressive muscle relaxation.  Education: Stress Management, Discharge Planning.   Education Outcome: Acknowledges edcuation/In group clarification offered/Needs additional education  Clinical Observations/Feedback: Patient attended the group.    Victorino Sparrow, LRT/CTRS         Victorino Sparrow A 01/25/2015 3:43 PM
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
## 2015-01-25 NOTE — BHH Group Notes (Signed)
[facility] LCSW Aftercare Discharge Planning Group Note   01/25/2015 8:46 AM  Participation Quality:  DID NOT ATTEND. Pt chose to remain in bed.   Smart, [employer]
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
## 2015-01-25 NOTE — ED Notes (Signed)
Pt transported to [facility] by [facility] transportation service for continuation of specialized care. Belongings given to driver after patient signed for them. Pt left in no acute distress. 

## 2015-01-25 NOTE — Tx Team (Signed)
Initial Interdisciplinary Treatment Plan   PATIENT STRESSORS: Financial difficulties Health problems   PATIENT STRENGTHS: Ability for insight Average or above average intelligence Capable of independent living General fund of knowledge Motivation for treatment/growth Supportive family/friends   PROBLEM LIST: Problem List/Patient Goals Date to be addressed Date deferred Reason deferred Estimated date of resolution  "Obtain sobriety" 01/25/2015     "Work on coping skills" 01/25/2015     Depression 01/25/2015     Alcohol Dependence 01/25/2015                                    DISCHARGE CRITERIA:  Ability to meet basic life and health needs Adequate post-discharge living arrangements Improved stabilization in mood, thinking, and/or behavior Medical problems require only outpatient monitoring Motivation to continue treatment in a less acute level of care Need for constant or close observation no longer present Verbal commitment to aftercare and medication compliance Withdrawal symptoms are absent or subacute and managed without 24-hour nursing intervention  PRELIMINARY DISCHARGE PLAN: Attend aftercare/continuing care group Outpatient therapy Return to previous living arrangement Return to previous work or school arrangements  PATIENT/FAMIILY INVOLVEMENT: This treatment plan has been presented to and reviewed with the patient, Stacy Pena.  The patient and family have been given the opportunity to ask questions and make suggestions.  Gildardo Pounds 01/25/2015, 3:20 AM

## 2015-01-25 NOTE — Progress Notes (Signed)
Pt attended NA speaker meeting and was engaged and attentive.
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
## 2015-01-25 NOTE — Progress Notes (Signed)
D:  Per pt self inventory pt reports sleeping poor, appetite poor, energy level low, ability to pay attention poor, rates depression at an 8 out of 10, hopelessness at a 7 out of 10, anxiety at an 8 out of 10, denies SI/HI/AVH, c/o of some tremors/withdrawal symptoms today on and off, is on ativan protocol, anxious/depressed during interaction, goal today: "new coping techniques, attend groups"      A:  Emotional support provided, Encouraged pt to continue with treatment plan and attend all group activities, q15 min checks maintained for safety.  R:  Pt is receptive, going to groups, interacts appropriately, pleasant and cooperative with staff and other patients on the unit, visible in milieu.

## 2015-01-25 NOTE — BHH Group Notes (Signed)
[place] LCSW Group Therapy  01/25/2015 1:13 PM  Type of Therapy:  Group Therapy  Participation Level:  Did Not Attend-pt was meeting with MD  Summary of Progress/Problems: Emotion Regulation: This group focused on both positive and negative emotion identification and allowed group members to process ways to identify feelings, regulate negative emotions, and find healthy ways to manage internal/external emotions. Group members were asked to reflect on a time when their reaction to an emotion led to a negative outcome and explored how alternative responses using emotion regulation would have benefited them. Group members were also asked to discuss a time when emotion regulation was utilized when a negative emotion was experienced.   Smart, Tomasa Dobransky LCSWA  01/25/2015, 1:13 PM

## 2015-01-26 LAB — URINE CULTURE: SPECIAL REQUESTS: NORMAL

## 2015-01-26 NOTE — Progress Notes (Signed)
D:  Per pt self inventory pt reports sleeping poor, appetite poor, energy level low, ability to pay attention poor, rates depression at a 9 out of 10, hopelessness at a 9 out of 10, anxiety at a 9 out of 10, depressed/anxious during interaction, goal today: "listen, learn and go to groups"    A:  Emotional support provided, Encouraged pt to continue with treatment plan and attend all group activities, q15 min checks maintained for safety.  R:  Pt is receptive, going to groups, interacts appropriately, pleasant and cooperative with staff and other patients on the unit.

## 2015-01-26 NOTE — Progress Notes (Signed)
Pt has been in the dayroom most of the evening talking with peers.  She had visitors this evening.  She reports her withdrawal symptoms have been moderate today, but that the Ativan has helped.  She has also used Vistaril as well.  She denies SI/HI/AVH.  She wants to go for long term treatment for her alcohol abuse.  Review of pt's chart show that referrals have been sent out.  Writer also requested an order for nystatin powder for the yeast infection under pt's breast, which she says she will use at bedtime.  Pt is polite and cooperative with staff.  She is making her needs known to staff.  Support and encouragement offered.  Safety maintained with q15 minute checks.

## 2015-01-26 NOTE — BHH Group Notes (Signed)
[place] LCSW Group Therapy  01/26/2015 11:16 AM  Type of Therapy:  Group Therapy  Participation Level:  Did Not Attend-pt chose to rest in her room.   Summary of Progress/Problems:  Finding Balance in Life. Today's group focused on defining balance in one's own words, identifying things that can knock one off balance, and exploring healthy ways to maintain balance in life. Group members were asked to provide an example of a time when they felt off balance, describe how they handled that situation,and process healthier ways to regain balance in the future. Group members were asked to share the most important tool for maintaining balance that they learned while at [facility] and how they plan to apply this method after discharge.   Smart, Greco Gastelum LCSWA  01/26/2015, 11:16 AM

## 2015-01-26 NOTE — BHH Suicide Risk Assessment (Signed)
[place] INPATIENT:  Family/Significant Other Suicide Prevention Education  Suicide Prevention Education:  Patient Refusal for Family/Significant Other Suicide Prevention Education: The patient Stacy Pena has refused to provide written consent for family/significant other to be provided Family/Significant Other Suicide Prevention Education during admission and/or prior to discharge.  Physician notified.  SPE completed with pt, as pt refused to consent to family contact. SPI pamphlet provided to pt and pt was encouraged to share information with support network, ask questions, and talk about any concerns relating to SPE. Pt denies access to guns/firearms and verbalized understanding of information provided. Mobile Crisis information also provided to pt.   Smart, Treazure Nery LCSWA  01/26/2015, 10:50 AM

## 2015-01-26 NOTE — Progress Notes (Signed)
[facility] MD Progress Note  01/26/2015 7:24 PM Stacy Pena  MRN:  [id number] Subjective:  Stacy Pena states she is trying to get her life back together. She is still dealing with the depression and the anxiety. States she is worried about her health and has a lot of regrets and feelings of guilt for what she has done to herself and her family by drinking Principal Problem: Alcohol dependence with alcohol-induced mood disorder Diagnosis:   Patient Active Problem List   Diagnosis Date Noted  . Alcohol dependence with alcohol-induced mood disorder [F10.24]   . Anxiety [F41.9] 11/28/2014  . Suicidal ideation [R45.851] 11/28/2014  . Abnormal liver function test [R79.89] 11/28/2014  . Alcohol intoxication [F10.129] 11/28/2014  . Nausea vomiting and diarrhea [R11.2, R19.7] 11/28/2014  . GERD (gastroesophageal reflux disease) [K21.9]   . Bipolar 1 disorder [F31.9]   . Hypertension [I10]   . Alcoholism [F10.20]   . Depression [F32.9]   . Essential hypertension [I10]   . Acute hypokalemia [E87.6] 07/27/2014  . Elevated TSH [R94.6] 07/27/2014  . Fibroids [D25.9] 07/27/2014  . Liver lesion [K76.89] 07/27/2014  . Elevated serum human chorionic gonadotropin (hCG) level in female, not pregnant [E34.9] 07/27/2014  . Ileitis, terminal [K50.00] 07/27/2014  . Abdominal pain [R10.9] 07/26/2014  . Acute alcoholic pancreatitis [J24.2] 07/26/2014  . Dehydration [E86.0] 07/26/2014  . Pancreatitis [K85.9] 07/18/2014   Total Time spent with patient: 30 minutes   Past Medical History:  Past Medical History  Diagnosis Date  . Bipolar 1 disorder   . Hypertension   . Alcoholism   . Pancreatitis 07/2014  . GERD (gastroesophageal reflux disease)   . Depression     Past Surgical History  Procedure Laterality Date  . Ovarian cyst removal    . Appendectomy    . Wisdom tooth extraction    . Laparoscopic unilateral salpingo oopherectomy     Family History:  Family History  Problem Relation Age of Onset  .  Diabetes Father   . Breast cancer Mother   . Depression Mother   . Alcohol abuse Maternal Grandfather   . Alcohol abuse Maternal Grandmother    Social History:  History  Alcohol Use  . 60.0 oz/week  . 100 Shots of liquor per week    Comment: heavily     History  Drug Use No    Social History   Social History  . Marital Status: Single    Spouse Name: N/A  . Number of Children: N/A  . Years of Education: N/A   Social History Main Topics  . Smoking status: Current Every Day Smoker -- 1.00 packs/day for 10 years    Types: Cigarettes  . Smokeless tobacco: Never Used  . Alcohol Use: 60.0 oz/week    100 Shots of liquor per week     Comment: heavily  . Drug Use: No  . Sexual Activity: Not Currently   Other Topics Concern  . None   Social History Narrative   Additional History:    Sleep: Fair  Appetite:  Fair   Assessment:   Musculoskeletal: Strength & Muscle Tone: within normal limits Gait & Station: normal Patient leans: normal   Psychiatric Specialty Exam: Physical Exam  Review of Systems  Constitutional: Negative.   HENT: Negative.   Eyes: Negative.   Respiratory: Negative.   Cardiovascular: Negative.   Gastrointestinal: Negative.   Genitourinary: Negative.   Musculoskeletal: Negative.   Skin: Negative.   Neurological: Negative.   Endo/Heme/Allergies: Negative.   Psychiatric/Behavioral: Positive for depression  and substance abuse. The patient is nervous/anxious.     Blood pressure 107/76, pulse 93, temperature 97.9 F (36.6 C), temperature source Oral, resp. rate 16, height 5' 2.6" (1.59 m), weight 72.122 kg (159 lb).Body mass index is 28.53 kg/(m^2).  General Appearance: Fairly Groomed  Engineer, water::  Fair  Speech:  Clear and Coherent  Volume:  Decreased  Mood:  Anxious and worried  Affect:  sad, anxious worried  Thought Process:  Coherent and Goal Directed  Orientation:  Full (Time, Place, and Person)  Thought Content:  symptoms events  worries concerns  Suicidal Thoughts:  No  Homicidal Thoughts:  No  Memory:  Immediate;   Fair Recent;   Fair Remote;   Fair  Judgement:  Fair  Insight:  Present  Psychomotor Activity:  Normal  Concentration:  Fair  Recall:  [employer] [place]  Language: Fair  Akathisia:  No  Handed:  Right  AIMS (if indicated):     Assets:  Desire for Improvement Housing Social Support Vocational/Educational  ADL's:  Intact  Cognition: WNL  Sleep:  Number of Hours: 6.5     Current Medications: Current Facility-Administered Medications  Medication Dose Route Frequency Provider Last Rate Last Dose  . acetaminophen (TYLENOL) tablet 650 mg  650 mg Oral Q6H PRN Laverle Hobby, PA-C      . alum & mag hydroxide-simeth (MAALOX/MYLANTA) 200-200-20 MG/5ML suspension 30 mL  30 mL Oral Q4H PRN Laverle Hobby, PA-C      . famotidine (PEPCID) tablet 20 mg  20 mg Oral BID Laverle Hobby, PA-C   20 mg at 01/26/15 1645  . feeding supplement (BOOST / RESOURCE BREEZE) liquid 1 Container  1 Container Oral TID BM Clayton Bibles, RD   1 Container at 43/32/95 1884  . folic acid (FOLVITE) tablet 1 mg  1 mg Oral Daily Laverle Hobby, PA-C   1 mg at 01/26/15 0753  . hydrOXYzine (ATARAX/VISTARIL) tablet 25 mg  25 mg Oral Q6H PRN Laverle Hobby, PA-C   25 mg at 01/26/15 1646  . loperamide (IMODIUM) capsule 2-4 mg  2-4 mg Oral PRN Laverle Hobby, PA-C      . LORazepam (ATIVAN) tablet 1 mg  1 mg Oral Q6H PRN Laverle Hobby, PA-C   1 mg at 01/25/15 1508  . LORazepam (ATIVAN) tablet 1 mg  1 mg Oral TID Laverle Hobby, PA-C   1 mg at 01/26/15 1645   Followed by  . [START ON 01/27/2015] LORazepam (ATIVAN) tablet 1 mg  1 mg Oral BID Laverle Hobby, PA-C       Followed by  . [START ON 01/29/2015] LORazepam (ATIVAN) tablet 1 mg  1 mg Oral Daily Spencer E Simon, PA-C      . magnesium hydroxide (MILK OF MAGNESIA) suspension 30 mL  30 mL Oral Daily PRN Laverle Hobby, PA-C      . multivitamin with minerals tablet  1 tablet  1 tablet Oral Daily Laverle Hobby, PA-C   1 tablet at 01/26/15 0754  . nicotine (NICODERM CQ - dosed in mg/24 hours) patch 21 mg  21 mg Transdermal Daily Laverle Hobby, PA-C   21 mg at 01/26/15 0753  . nystatin (MYCOSTATIN/NYSTOP) topical powder   Topical BID Laverle Hobby, PA-C      . ondansetron (ZOFRAN-ODT) disintegrating tablet 4 mg  4 mg Oral Q6H PRN Laverle Hobby, PA-C      . thiamine (VITAMIN B-1) tablet  100 mg  100 mg Oral Daily Laverle Hobby, PA-C   100 mg at 01/26/15 0754  . traZODone (DESYREL) tablet 50 mg  50 mg Oral QHS,MR X 1 Laverle Hobby, PA-C   50 mg at 01/25/15 2119    Lab Results:  Results for orders placed or performed during the hospital encounter of 01/25/15 (from the past 48 hour(s))  Lipase, blood     Status: Abnormal   Collection Time: 01/25/15  6:45 AM  Result Value Ref Range   Lipase 16 (L) 22 - 51 U/L    Comment: Performed at [facility]  Amylase     Status: None   Collection Time: 01/25/15  6:45 AM  Result Value Ref Range   Amylase 87 28 - 100 U/L    Comment: Performed at [facility]    Physical Findings: AIMS: Facial and Oral Movements Muscles of Facial Expression: Minimal Lips and Perioral Area: None, normal Jaw: None, normal Tongue: None, normal,Extremity Movements Upper (arms, wrists, hands, fingers): None, normal Lower (legs, knees, ankles, toes): None, normal, Trunk Movements Neck, shoulders, hips: None, normal, Overall Severity Severity of abnormal movements (highest score from questions above): None, normal Incapacitation due to abnormal movements: None, normal Patient's awareness of abnormal movements (rate only patient's report): No Awareness, Dental Status Current problems with teeth and/or dentures?: Yes (several missing teeth) Does patient usually wear dentures?: No  CIWA:  CIWA-Ar Total: 5 COWS:  COWS Total Score: 8  Treatment Plan Summary: Daily contact with patient to  assess and evaluate symptoms and progress in treatment and Medication management Supportive approach/coping skills Alcohol dependence; continue the Detox protocol Mood instability; continue to monitor as we detox, assess for the need for  psychotropics Work a relapse prevention plan Facilitate admission to [facility] Decision Making:  Review of Psycho-Social Stressors (1) and Review of Medication Regimen & Side Effects (2)     Fannie Gathright A 01/26/2015, 7:24 PM

## 2015-01-26 NOTE — Clinical Social Work Note (Signed)
Phone interview scheduled with Terrence Dupont from the [facility] today at 11:00AM. Per Terrence Dupont, possible Saturday admission; insurance may cover at 100%. Pt made aware.   Maxie Better, LCSWA Clinical Social Worker 01/26/2015 10:34 AM

## 2015-01-26 NOTE — Progress Notes (Signed)
Patient attended [place] group and participated.

## 2015-01-27 ENCOUNTER — Telehealth (HOSPITAL_COMMUNITY): Payer: Self-pay

## 2015-01-27 DIAGNOSIS — F1020 Alcohol dependence, uncomplicated: Secondary | ICD-10-CM | POA: Diagnosis present

## 2015-01-27 MED ORDER — ADULT MULTIVITAMIN W/MINERALS CH: 1.0000 | ORAL_TABLET | Freq: Every day | ORAL | Status: AC

## 2015-01-27 MED ORDER — TRAZODONE HCL 50 MG PO TABS: 50.0000 mg | ORAL_TABLET | Freq: Every evening | ORAL | Status: DC | PRN

## 2015-01-27 MED ORDER — NICOTINE 21 MG/24HR TD PT24: 21.0000 mg | MEDICATED_PATCH | Freq: Every day | TRANSDERMAL | Status: AC

## 2015-01-27 MED ORDER — THIAMINE HCL 100 MG PO TABS: 100.0000 mg | ORAL_TABLET | Freq: Every day | ORAL | Status: AC

## 2015-01-27 MED ORDER — NYSTATIN 100000 UNIT/GM EX POWD: 1.0000 g | Freq: Two times a day (BID) | CUTANEOUS | Status: AC

## 2015-01-27 MED ORDER — FOLIC ACID 1 MG PO TABS: 1.0000 mg | ORAL_TABLET | Freq: Every day | ORAL | Status: AC

## 2015-01-27 MED ORDER — FAMOTIDINE 20 MG PO TABS: 20.0000 mg | ORAL_TABLET | Freq: Two times a day (BID) | ORAL | Status: AC

## 2015-01-27 NOTE — BHH Group Notes (Signed)
[facility] Group Notes:  (Nursing/MHT/Case Management/Adjunct)  Date:   01/26/2015  LATE ENTRY Time:  0900  Type of Therapy:  Nurse Education : The group focused on helping patients identify unhealthy behaviors..  Participation Level:  Active  Participation Quality:  Appropriate  Affect:  Anxious  Cognitive:  Appropriate  Insight:  Appropriate  Engagement in Group:  Engaged  Modes of Intervention:  Discussion  Summary of Progress/Problems:  Lauralyn Primes 01/27/2015, 10:09 AM

## 2015-01-27 NOTE — Progress Notes (Signed)
  [facility] Adult Case Management Discharge Plan :  Will you be returning to the same living situation after discharge:  No.Pt accepted into [facility] for today by 5pm.  At discharge, do you have transportation home?: Yes,  pt's daughter will pick her up at 11:00AM Do you have the ability to pay for your medications: Yes,  [employer]  Release of information consent forms completed and submitted to medical records by CSW.  Patient to Follow up at: Follow-up Information    Follow up with [facility] On 01/27/2015.   Why:  Per Terrence Dupont, you have been accepted on this date. Please arrive no later than 5:00PM for admission. Thank you.    Contact information:   [place] [address] Phone: [phone] Fax: [phone]      Patient denies SI/HI: Yes,  during group/self report.     Safety Planning and Suicide Prevention discussed: Yes,  SPE completed with pt, as she refused to consent to family contact. SPI pamphlet provided to pt and she was encouraged to share this information with her support network.   Have you used any form of tobacco in the last 30 days? (Cigarettes, Smokeless Tobacco, Cigars, and/or Pipes): Yes  Has patient been referred to the Quitline?: Patient refused referral  Smart, Alicia Amel  01/27/2015, 9:58 AM

## 2015-01-27 NOTE — BHH Group Notes (Signed)
[facility] LCSW Aftercare Discharge Planning Group Note   01/27/2015 9:57 AM  Participation Quality:  DID NOT ATTEND. Pt chose to remain in room to rest.   Smart, Alicia Amel

## 2015-01-27 NOTE — Progress Notes (Signed)
Pt reports she is doing a little better.  She reports she is still having some withdrawal symptoms.  She denies SI/HI/AVH at this time.  She wants to go to long term rehab because she is tired of drinking so much.  She says her family is supportive.  She makes her needs known to staff.  She states she has gone to some groups today.  She has minimal interaction with peers, but her behavior is appropriate on the unit.  Support and encouragement offered.  Safety maintained with q15 minute checks.

## 2015-01-27 NOTE — Progress Notes (Signed)
Pt d/c from the hospital. All items returned. D/C instructions given, prescriptions given and samples given. Pt denies si and hi. 

## 2015-01-27 NOTE — BHH Suicide Risk Assessment (Signed)
[facility] Discharge Suicide Risk Assessment   Demographic Factors:  NA  Total Time spent with patient: 30 minutes  Musculoskeletal: Strength & Muscle Tone: within normal limits Gait & Station: normal Patient leans: normal  Psychiatric Specialty Exam: Physical Exam  Review of Systems  Constitutional: Negative.   HENT: Negative.   Eyes: Negative.   Respiratory: Negative.   Cardiovascular: Negative.   Gastrointestinal: Negative.   Genitourinary: Negative.   Musculoskeletal: Negative.   Skin: Negative.   Neurological: Negative.   Endo/Heme/Allergies: Negative.   Psychiatric/Behavioral: Positive for depression and substance abuse. The patient is nervous/anxious.     Blood pressure 107/84, pulse 89, temperature 98 F (36.7 C), temperature source Oral, resp. rate 16, height 5' 2.6" (1.59 m), weight 72.122 kg (159 lb).Body mass index is 28.53 kg/(m^2).  General Appearance: Fairly Groomed  Engineer, water::  Fair  Speech:  Normal [id number]  Volume:  Normal  Mood:  Euthymic  Affect:  Appropriate  Thought Process:  Coherent and Goal Directed  Orientation:  Full (Time, Place, and Person)  Thought Content:  plans as she moves on, relapse prevention plan  Suicidal Thoughts:  No  Homicidal Thoughts:  No  Memory:  Immediate;   Fair Recent;   Fair Remote;   Fair  Judgement:  Fair  Insight:  Present  Psychomotor Activity:  Normal  Concentration:  Fair  Recall:  [employer] [place]  Language: Fair  Akathisia:  No  Handed:  Right  AIMS (if indicated):     Assets:  Desire for Improvement Housing Talents/Skills Vocational/Educational  Sleep:  Number of Hours: 6.5  Cognition: WNL  ADL's:  Intact   Have you used any form of tobacco in the last 30 days? (Cigarettes, Smokeless Tobacco, Cigars, and/or Pipes): Yes  Has this patient used any form of tobacco in the last 30 days? (Cigarettes, Smokeless Tobacco, Cigars, and/or Pipes) Yes, Prescription not provided because: will be  admitted to a residential treatment facility  Mental Status Per Nursing Assessment::   On Admission:  NA  Current Mental Status by Physician: In full contact with reality. There are no active S/S of withdrawal. There are no active SI plans or intent. She is willing and motivated to pursue residential treatment.    Loss Factors: Decline in physical health  Historical Factors: NA  Risk Reduction Factors: employed, sense of responsibility to family, commitment    Continued Clinical Symptoms:  Depression:   Comorbid alcohol abuse/dependence Alcohol/Substance Abuse/Dependencies  Cognitive Features That Contribute To Risk:  None    Suicide Risk:  Minimal: No identifiable suicidal ideation.  Patients presenting with no risk factors but with morbid ruminations; may be classified as minimal risk based on the severity of the depressive symptoms  Principal Problem: Alcohol dependence with alcohol-induced mood disorder Discharge Diagnoses:  Patient Active Problem List   Diagnosis Date Noted  . Alcohol dependence [F10.20] 01/27/2015  . Alcohol dependence with alcohol-induced mood disorder [F10.24]   . Anxiety [F41.9] 11/28/2014  . Suicidal ideation [R45.851] 11/28/2014  . Abnormal liver function test [R79.89] 11/28/2014  . Alcohol intoxication [F10.129] 11/28/2014  . Nausea vomiting and diarrhea [R11.2, R19.7] 11/28/2014  . GERD (gastroesophageal reflux disease) [K21.9]   . Bipolar 1 disorder [F31.9]   . Hypertension [I10]   . Alcoholism [F10.20]   . Depression [F32.9]   . Essential hypertension [I10]   . Acute hypokalemia [E87.6] 07/27/2014  . Elevated TSH [R94.6] 07/27/2014  . Fibroids [D25.9] 07/27/2014  . Liver lesion [K76.89] 07/27/2014  .  Elevated serum human chorionic gonadotropin (hCG) level in female, not pregnant [E34.9] 07/27/2014  . Ileitis, terminal [K50.00] 07/27/2014  . Abdominal pain [R10.9] 07/26/2014  . Acute alcoholic pancreatitis [X32.3] 07/26/2014  .  Dehydration [E86.0] 07/26/2014  . Pancreatitis [K85.9] 07/18/2014    Follow-up Information    Follow up with [facility] On 01/27/2015.   Why:  Per Terrence Dupont, you have been accepted on this date. Please arrive no later than 5:00PM for admission. Thank you.    Contact information:   [place] [address] Phone: [phone] Fax: [phone]      Plan Of Care/Follow-up recommendations:  Activity:  as tolerated Diet:  regular Follow up [place] Is patient on multiple antipsychotic therapies at discharge:  No   Has Patient had three or more failed trials of antipsychotic monotherapy by history:  No  Recommended Plan for Multiple Antipsychotic Therapies: NA    Lateia Fraser A 01/27/2015, 11:11 AM
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
## 2015-01-27 NOTE — Progress Notes (Signed)
Recreation Therapy Notes  Date: 08.12.16 Time: 9:30 am Location: [address] Group Room  Group Topic: Stress Management  Goal Area(s) Addresses:  Patient will verbalize importance of using healthy stress management.  Patient will identify positive emotions associated with healthy stress management.   Intervention: Stress Management  Activity :  Guided Automotive engineer.  LRT will introduce and educate the patients on the stress management technique of guided imagery.  A script was used to deliver the technique to the patients.  Patients were asked to follow the script read aloud by the LRT to engage in the stress management technique.  Education:  Stress Management, Discharge Planning.   Education Outcome: Acknowledges edcuation/In group clarification offered/Needs additional education  Clinical Observations/Feedback: Patient did not attend group.   Victorino Sparrow, LRT/CTRS         Victorino Sparrow A 01/27/2015 4:10 PM
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
## 2015-01-27 NOTE — Telephone Encounter (Signed)
Post ED Visit - Positive Culture Follow-up  Culture report reviewed by antimicrobial stewardship pharmacist: []  Wes [place], Pharm.D., BCPS []  Heide Guile, Pharm.D., BCPS []  Alycia Rossetti, Pharm.D., BCPS []  [place], [place].D., BCPS, AAHIVP []  Legrand Como, Pharm.D., BCPS, AAHIVP []  Isac Sarna, Pharm.D., BCPS X  Stewart,Cassie, Pharm. D.  Positive URine culture, >/= 100,000 colonies -> Enterococcus Species Pt admitted.  Dortha Kern 01/27/2015, 11:36 AM

## 2015-03-06 ENCOUNTER — Other Ambulatory Visit (HOSPITAL_COMMUNITY): Payer: 59 | Attending: Psychiatry | Admitting: Psychology

## 2015-03-06 ENCOUNTER — Encounter (HOSPITAL_COMMUNITY): Payer: Self-pay | Admitting: Medical

## 2015-03-06 ENCOUNTER — Other Ambulatory Visit (HOSPITAL_COMMUNITY): Payer: Self-pay | Admitting: Medical

## 2015-03-06 DIAGNOSIS — F17210 Nicotine dependence, cigarettes, uncomplicated: Secondary | ICD-10-CM | POA: Insufficient documentation

## 2015-03-06 DIAGNOSIS — F4312 Post-traumatic stress disorder, chronic: Secondary | ICD-10-CM | POA: Insufficient documentation

## 2015-03-06 DIAGNOSIS — Z6372 Alcoholism and drug addiction in family: Secondary | ICD-10-CM

## 2015-03-06 DIAGNOSIS — K219 Gastro-esophageal reflux disease without esophagitis: Secondary | ICD-10-CM

## 2015-03-06 DIAGNOSIS — F1024 Alcohol dependence with alcohol-induced mood disorder: Secondary | ICD-10-CM

## 2015-03-06 DIAGNOSIS — Z62810 Personal history of physical and sexual abuse in childhood: Secondary | ICD-10-CM | POA: Diagnosis not present

## 2015-03-06 DIAGNOSIS — F3162 Bipolar disorder, current episode mixed, moderate: Secondary | ICD-10-CM | POA: Insufficient documentation

## 2015-03-06 DIAGNOSIS — F10239 Alcohol dependence with withdrawal, unspecified: Secondary | ICD-10-CM

## 2015-03-06 DIAGNOSIS — F319 Bipolar disorder, unspecified: Secondary | ICD-10-CM

## 2015-03-06 DIAGNOSIS — K852 Alcohol induced acute pancreatitis without necrosis or infection: Secondary | ICD-10-CM

## 2015-03-06 DIAGNOSIS — F1020 Alcohol dependence, uncomplicated: Secondary | ICD-10-CM | POA: Diagnosis present

## 2015-03-06 DIAGNOSIS — I158 Other secondary hypertension: Secondary | ICD-10-CM

## 2015-03-06 MED ORDER — CITALOPRAM HYDROBROMIDE 20 MG PO TABS
20.0000 mg | ORAL_TABLET | Freq: Every day | ORAL | Status: DC
Start: 2015-03-06 — End: 2015-03-20

## 2015-03-06 MED ORDER — LAMOTRIGINE 200 MG PO TABS: 200.0000 mg | ORAL_TABLET | Freq: Every day | ORAL | Status: DC

## 2015-03-06 MED ORDER — ACAMPROSATE CALCIUM 333 MG PO TBEC: 666.0000 mg | DELAYED_RELEASE_TABLET | Freq: Three times a day (TID) | ORAL | Status: AC

## 2015-03-06 MED ORDER — ARIPIPRAZOLE 5 MG PO TABS: ORAL_TABLET | ORAL | Status: DC

## 2015-03-06 MED ORDER — TRAZODONE HCL 100 MG PO TABS: 100.0000 mg | ORAL_TABLET | Freq: Every day | ORAL | Status: DC
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
## 2015-03-07 ENCOUNTER — Other Ambulatory Visit (HOSPITAL_COMMUNITY): Payer: 59

## 2015-03-08 ENCOUNTER — Other Ambulatory Visit (HOSPITAL_COMMUNITY): Payer: 59 | Admitting: Psychology

## 2015-03-08 DIAGNOSIS — F319 Bipolar disorder, unspecified: Secondary | ICD-10-CM

## 2015-03-08 DIAGNOSIS — Z6372 Alcoholism and drug addiction in family: Secondary | ICD-10-CM | POA: Insufficient documentation

## 2015-03-08 DIAGNOSIS — F1020 Alcohol dependence, uncomplicated: Secondary | ICD-10-CM

## 2015-03-08 DIAGNOSIS — F10239 Alcohol dependence with withdrawal, unspecified: Secondary | ICD-10-CM | POA: Insufficient documentation

## 2015-03-08 DIAGNOSIS — Z62810 Personal history of physical and sexual abuse in childhood: Secondary | ICD-10-CM | POA: Insufficient documentation

## 2015-03-08 DIAGNOSIS — K852 Alcohol induced acute pancreatitis without necrosis or infection: Secondary | ICD-10-CM

## 2015-03-09 ENCOUNTER — Other Ambulatory Visit (HOSPITAL_COMMUNITY): Payer: 59 | Admitting: Psychology

## 2015-03-09 DIAGNOSIS — F1020 Alcohol dependence, uncomplicated: Secondary | ICD-10-CM | POA: Diagnosis not present

## 2015-03-10 ENCOUNTER — Other Ambulatory Visit (HOSPITAL_COMMUNITY): Payer: 59

## 2015-03-10 LAB — 9+OXYCO+ALC-UNBUND, ORAL FLUID

## 2015-03-10 LAB — TOXASSURE SELECT 13 (MW), URINE: PDF: 0

## 2015-03-12 ENCOUNTER — Encounter (HOSPITAL_COMMUNITY): Payer: Self-pay | Admitting: Psychology

## 2015-03-12 NOTE — Addendum Note (Signed)
Addended byAmalia Hailey, ANN on: 03/12/2015 12:24 PM   Modules accepted: Medications

## 2015-03-12 NOTE — Progress Notes (Signed)
    Daily Group Progress Note  Program: CD-IOP   Group Time: 1-2:30 pm  Participation Level: Active  Behavioral Response: Sharing  Type of Therapy: Process Group  Topic: The first part of group was spent in process where group members shared about obstacles and challenges related to their recovery. All group members were active and engaged in session. A drug test was administered to each member.  Patients also received the results of a previous drug test.  Counselor encouraged further insight into group member's issues through linking member's responses.  There was one new member who introduced herself to the group and was open about seeking help in her recovery. Members were very receptive of her and exchanged contact information to help support her.  Group Time: 2:45- 4 pm  Participation Level: Active  Behavioral Response: Sharing  Type of Therapy: Psycho-education Group  Topic:  Psycho-Ed: Counselor led group in a worksheet entitled "My Dangerous Situation". Clients wrote down their most triggering situations for their addictions. All group members were active and engaged in the writing and discussion process. Some members shared about specific instances of possible relapse situations and were able to process reasons why they were "Dangerous".   Summary: Today was the patient's first group session. She shared openly about how she had struggled with her alcoholism for a long time. She has suffered greatly as a result of her drinking, including a daughter who will not speak to her. Pt wants to establish new habits and new hobbies. Pt reports being nervous to attend AA meetings but other group members encouraged her to go as soon as possible. During the second half of group, the patient met with the program director for her psychiatric assessment. The patient responded well to this first intervention. Her sobriety date is 9/9.  Family Program: Family present? No   Name of family  member(s):   UDS collected: Yes Results: pending  AA/NA attended?: No, but she is new to the program and will begin attending 12-step meetings regularly.  Sponsor?: No   EVANS,ANN, LCAS

## 2015-03-13 ENCOUNTER — Encounter (HOSPITAL_COMMUNITY): Payer: Self-pay | Admitting: Psychology

## 2015-03-13 ENCOUNTER — Other Ambulatory Visit (HOSPITAL_COMMUNITY): Payer: 59 | Admitting: Psychology

## 2015-03-13 ENCOUNTER — Telehealth (HOSPITAL_COMMUNITY): Payer: Self-pay | Admitting: Psychology

## 2015-03-13 NOTE — Progress Notes (Signed)
Stacy Pena is a 49 y.o. female patient. CD-IOP: Excused Absence. The patient will not be in group today. She is stuck in [place] trying to get out on the next available flight. She apologized, but explained that her weekend plans changed and she came down to [place] versus having her friend come up to [place]. Her birthday was Friday and during the last group session, we had discussed at length her weekend plans and the importance of being accountable. She had admitted that her birthday was a big trigger so the conversation had addressed the importance of scheduling and keeping herself busy. She will be excused from group today.         EVANS,ANN, LCAS

## 2015-03-13 NOTE — Progress Notes (Signed)
    Daily Group Progress Note  Program: CD-IOP   Group Time: 1-2:30 pm  Participation Level: Active  Behavioral Response: Sharing  Type of Therapy: Process Group  Topic: Process: the first part of group was spent in process. Members shared about their successes and challenges in early recovery. Also disclosed were the things they had done to support and maintain their sobriety, including attending 12-step meetings or even practicing good self-care.  Group Time: 2:45- 4pm  Participation Level: Active  Behavioral Response: Appropriate  Type of Therapy: Psycho-education Group  Topic: Psycho-Ed: Bio-Psycho-Social-Spiritual/Graduation. The second half of group was spent in a psycho-ed. A presentation was provided identifying the 4 elements that combined, lead to addiction. Near the end of the session, a ceremony was held honoring a member who was successfully graduating from the program today. His wife appeared as did a former group member who was there for support. The ceremony proved very touching and the graduating member was very emotional as he expressed his gratitude to his family, his fellow group members and this program.   Summary: The patient reported that she had followed through with the commitment she had made to the group yesterday. She had attended the 10:00 Women's AA meeting at the [facility] this morning. She reported it had been "really good" and explained that "I could relate to what they were saying". The patient reported she had felt very welcomed and had received at least 25 phone numbers from other women. The patient reported one woman had given her a Big Book and her phone number and asked that she phone her every day.  Group members applauded this news and she agreed that she would be going back to that meeting. When a member asked her about her weekend plans and her upcoming birthday tomorrow, the patient reported that a close friend was coming in on Friday and  she didn't drink. They would spend the weekend together and she felt confident she could stay sober. In the psycho-ed, the patient shared little of herself, but she is also very new and will likely take time to warm up and feel more comfortable sharing about her life. Another member reminded her that she must not isolate and getting out of the house and spending time with others in recovery will be very important if she is to stay sober. The patient reminded the group that she had been drinking for 30 years and her alcoholism had caused many problems. When she admitted that this was her first treatment for her addiction, many were surprised to hear that she had gone so long without addressing it. During the graduation, the patient expressed her appreciation to the graduating member for being so welcoming on the very first day she appeared in group. The patient did what she said she would do and attended a meeting last night. With her birthday tomorrow and the weekend, this is a very risky time for her early sobriety. She responded well to this intervention and her sobriety date is 9/9.   Family Program: Family present? No   Name of family member(s):   UDS collected: No Results:  AA/NA attended?: YesThursday  Sponsor?: No, but she is new to the program   EVANS,ANN, LCAS

## 2015-03-14 ENCOUNTER — Encounter (HOSPITAL_COMMUNITY): Payer: Self-pay | Admitting: Psychology

## 2015-03-14 ENCOUNTER — Other Ambulatory Visit (HOSPITAL_COMMUNITY): Payer: 59

## 2015-03-14 NOTE — Progress Notes (Signed)
    Daily Group Progress Note  Program: CD-IOP   Group Time: 1-2:30 pm  Participation Level: Minimal  Behavioral Response: Appropriate  Type of Therapy: Psycho-education Group  Topic: Psycho-Ed: Guest; HIV/AIDS/Hep C/Safe Sex. The first half of group was spent in a psycho-ed. a guest speaker was present to address the group around issues of sexually transmitted diseases, how one contracts them and how one can avoid them. The discussion was frank, open and proved very informative to all present. Also in group today, were 2 PA students from [facility] [employer].  During group today the program director met with 2 group members to complete their discharge paperwork. They are both graduating this week.   Group Time: 2:45-4pm  Participation Level: Active  Behavioral Response: appropriate  Type of Therapy: Process Group  Topic: Process/Graduation: The second half of group was spent in process. Members shared about the things they had done to support their recovery since the last session. A brief discussion on the variety of addictions, including 'process' addictions occurred. As the session came to an end, a graduation ceremony was held for a successfully graduating member. A guest and former group member arrived for the ceremony as did the graduating [employer]  and good friend. It was an emotional session with kind words of admiration and hope laced with tears.   Summary: The patient was attentive, but shared little of herself in the psycho-ed. In process, she admitted that she has been up to her old behaviors, primarily 'isolating'. She described her typical day as sitting on the couch, watching TV and, in the past, drinking. The patient also reported that her birthday was Friday. When asked about plans, she reported that a good friend was coming up from [place] and they would spend the weekend together. The friend didn't drink and she had no plans to do so herself. The group urged her to  make plans beforehand and have some structure or she will likely struggle. When asked about attending any AA meetings, the patient admitted she had not gone to any meetings. I wondered if she would be willing to go to one before tomorrow's group session. The patient stated she would go and agreed to meet another member at the [facility] for the 10 am meeting. When questioned about any previous treatment, the patient admitted she had never been in treatment for her alcoholism. Despite having struggled with this illness for almost 25+ years, it was difficult to imagine that this program was her first treatment to address her alcoholism. The patient was shy and had to be invited to share, but she did open up a little bit with the group. Her sobriety date is 9/9.    Family Program: Family present? No   Name of family member(s):   UDS collected: No Results:  AA/NA attended?: No, but she assured the group she would attend a meeting before the next group session  Sponsor?: No   EVANS,ANN, LCAS

## 2015-03-15 ENCOUNTER — Other Ambulatory Visit (HOSPITAL_COMMUNITY): Payer: 59

## 2015-03-16 ENCOUNTER — Other Ambulatory Visit (HOSPITAL_COMMUNITY): Payer: 59

## 2015-03-17 ENCOUNTER — Other Ambulatory Visit (HOSPITAL_COMMUNITY): Payer: 59

## 2015-03-20 ENCOUNTER — Encounter (HOSPITAL_COMMUNITY): Payer: Self-pay | Admitting: Medical

## 2015-03-20 ENCOUNTER — Other Ambulatory Visit (HOSPITAL_COMMUNITY): Payer: Self-pay | Admitting: Medical

## 2015-03-20 ENCOUNTER — Other Ambulatory Visit (HOSPITAL_COMMUNITY): Payer: 59 | Attending: Psychiatry | Admitting: Psychology

## 2015-03-20 DIAGNOSIS — F1020 Alcohol dependence, uncomplicated: Secondary | ICD-10-CM | POA: Diagnosis present

## 2015-03-20 DIAGNOSIS — Z62810 Personal history of physical and sexual abuse in childhood: Secondary | ICD-10-CM

## 2015-03-20 DIAGNOSIS — F4312 Post-traumatic stress disorder, chronic: Secondary | ICD-10-CM | POA: Insufficient documentation

## 2015-03-20 DIAGNOSIS — F3162 Bipolar disorder, current episode mixed, moderate: Secondary | ICD-10-CM | POA: Insufficient documentation

## 2015-03-20 DIAGNOSIS — F10239 Alcohol dependence with withdrawal, unspecified: Secondary | ICD-10-CM

## 2015-03-20 DIAGNOSIS — L659 Nonscarring hair loss, unspecified: Secondary | ICD-10-CM | POA: Insufficient documentation

## 2015-03-20 DIAGNOSIS — Z6372 Alcoholism and drug addiction in family: Secondary | ICD-10-CM

## 2015-03-20 MED ORDER — ARIPIPRAZOLE 15 MG PO TABS: ORAL_TABLET | ORAL | Status: DC

## 2015-03-20 MED ORDER — TOPIRAMATE 25 MG PO TABS: ORAL_TABLET | ORAL | Status: DC
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
## 2015-03-21 ENCOUNTER — Other Ambulatory Visit (HOSPITAL_COMMUNITY): Payer: 59

## 2015-03-21 ENCOUNTER — Encounter (HOSPITAL_COMMUNITY): Payer: Self-pay | Admitting: Psychology

## 2015-03-21 NOTE — Progress Notes (Signed)
Stacy Pena is a 49 y.o. female patient. CD-IOP: Treatment Planning Session. Met with the patient this morning. She had returned to treatment yesterday after missing all 3 group sessions last week. She had been out of town on that Monday, but later admitted she was manic on that Wednesday and had gone on a spending spree. In group yesterday, she described herself as having been on a 'dry drunk', but insisted that she hadn't drunk. She is motivated and wants to change her life. Today she reported she had had to have a plumber come to her house so she had been unable to attend the 10 am AA meeting that she has gone to in past days. The patient noted that she was going to try a new meeting - there is a meeting for gays this evening. I asked about her sexual orientation and she reported that she doesn't feel ashamed of being a lesbian, but admitted she is not very open about it. I suggested she consider disclosing this to the group. They are very open and it will be a degree of honesty she has not yet shared. The patient agreed that it would feel more honest to disclose this and she agreed that she would do it when the opportunity presented itself. I explained the need to identify her goals of treatment. The patient was agreeable to identifying goals and stated that her primary goal was to remain alcohol-free. She also identified gaining support for her recovery and the need to meet others in recovery. When asked about any other goals, the patient identified getting in better health. She admitted that when she is drinking she doesn't take care of herself. The patient admitted she hasn't had a mammogram in over 2 years despite her high risk - her mother died at age 18 of breast cancer. The patient identified other ways she could embrace a healthier lifestyle. The treatment plan was reviewed, signed and completed accordingly. We will continue to meet weekly for individual counseling sessions. The patient responded well  to this session and her sobriety date remains 8/12.        Shawntavia Saunders, LCAS

## 2015-03-21 NOTE — Addendum Note (Signed)
Addended byAmalia Hailey, Citlaly Camplin on: 03/21/2015 09:18 AM   Modules accepted: Medications

## 2015-03-21 NOTE — Progress Notes (Signed)
    Daily Group Progress Note  Program: CD-IOP   Group Time: 1-2:30 pm  Participation Level: Active  Behavioral Response: Sharing  Type of Therapy: Process Group  Topic: Counselors met with group members for a group process time for 1.5 hours. One female member was absent, and a group member reported that the absent member had sent her a text asking her to notify her counselor of her absence due to being "sick". All present members were active and engaged in session, discussing challenges and successes in their recovery. Members reported their [place] meeting attendance and weekend activities. Drug tests were collected from the 2 female group members.  Group Time: 2:45- 4pm  Participation Level: Active  Behavioral Response: Appropriate  Type of Therapy: Psycho-education Group  Topic: Counselors met with group members for a group psycho-ed with 45 mins of Chair Yoga with a certified Yoga instructor. Members engaged in a calming seated yoga routine led by an individual counselor from [facility]. Members were encouraged to notice their somatic reactions and breathing. After, the counselor led Part 1 of a presentation on Communication. The different types of communication (passive, passive-aggressive, aggressive, and assertive) were discussed at length and counselors coached members during role-play on how they could improve their communication skills at work.    Summary: Patient was active and engaged in session. Patient was absent all last week and admitted that she had slipped back into behaviors of a "dry drunk". She did not relapse, but felt that she was isolating herself, spending money irresponsibly, and cleaning her house at 1:30AM. She described feelings of mania. During this session, the patient met with the program director to discuss her absences and reported manic behaviors. He has been monitoring her medications. Patient reports feelings of depression and shame surrounding her difficult  recovery. She has irregular eating and sleeping habits. Patient has a long history of alcoholism and her current level of dysfunction is expected. However, she reports that she is "committing to not missing" any more meetings and is motivated to change her behavior. She attended 1 AA meeting this morning and saw 2 other group members there. During the discussion on communication, the patient displayed good insight and made some excellent comments during the role-play. She was able to provide a good example of assertive communication and explained how she has to use these skills in her work. She responded well to this intervention and her sobriety date remains 8/12.   Family Program: Family present? No   Name of family member(s):   UDS collected: Yes Results: pending  AA/NA attended?: [place]  Sponsor?: No, but she is new and has been encouraged to seek a temporary sponsor   Emilynn Srinivasan, LCAS

## 2015-03-22 ENCOUNTER — Other Ambulatory Visit (HOSPITAL_COMMUNITY): Payer: Self-pay | Admitting: Medical

## 2015-03-22 ENCOUNTER — Other Ambulatory Visit (HOSPITAL_COMMUNITY): Payer: 59 | Admitting: Psychology

## 2015-03-22 ENCOUNTER — Other Ambulatory Visit: Payer: Self-pay

## 2015-03-22 DIAGNOSIS — F10239 Alcohol dependence with withdrawal, unspecified: Secondary | ICD-10-CM

## 2015-03-22 DIAGNOSIS — F1020 Alcohol dependence, uncomplicated: Secondary | ICD-10-CM

## 2015-03-22 DIAGNOSIS — F3162 Bipolar disorder, current episode mixed, moderate: Secondary | ICD-10-CM

## 2015-03-22 DIAGNOSIS — Z1231 Encounter for screening mammogram for malignant neoplasm of breast: Secondary | ICD-10-CM

## 2015-03-22 DIAGNOSIS — Z62810 Personal history of physical and sexual abuse in childhood: Secondary | ICD-10-CM

## 2015-03-22 MED ORDER — TRAZODONE HCL 100 MG PO TABS: 200.0000 mg | ORAL_TABLET | Freq: Every day | ORAL | Status: DC

## 2015-03-23 ENCOUNTER — Other Ambulatory Visit (HOSPITAL_COMMUNITY): Payer: 59 | Admitting: Psychology

## 2015-03-23 ENCOUNTER — Encounter (HOSPITAL_COMMUNITY): Payer: Self-pay | Admitting: Psychology

## 2015-03-23 DIAGNOSIS — F4312 Post-traumatic stress disorder, chronic: Secondary | ICD-10-CM

## 2015-03-23 DIAGNOSIS — F10239 Alcohol dependence with withdrawal, unspecified: Secondary | ICD-10-CM

## 2015-03-23 DIAGNOSIS — F3162 Bipolar disorder, current episode mixed, moderate: Secondary | ICD-10-CM

## 2015-03-23 DIAGNOSIS — F1020 Alcohol dependence, uncomplicated: Secondary | ICD-10-CM | POA: Diagnosis not present

## 2015-03-23 NOTE — Progress Notes (Signed)
    Daily Group Progress Note  Program: CD-IOP   Group Time: 1-2:30 pm  Participation Level: Active  Behavioral Response: Appropriate and Sharing  Type of Therapy: Process Group  Topic: Process: the first part of group was spent in process. Members shared about current issues and concerns that are presenting themselves in early recovery. Two group members were unexpectedly absent while a third was excused and attending a conference. As a result, only 3 members were present. There was good exchange and feedback among group members as they discussed challenges around family. Also present was a Presenter, broadcasting who has been in the group in previous sessions.  Group Time: 2:45- 4pm  Participation Level: Active  Behavioral Response: Sharing  Type of Therapy: Psycho-education Group  Topic: Psycho-Ed: The Five Love Languages. The second half of group was spent in a psycho-ed. a handout was provided with 30 questions. Members chose the better of two answers and then a discussion ensued about the five love languages. Members shared how they had scored relative to each of the 5 categories. While one member seemed to know his love language before discussing the findings, the two other members were pleasantly surprised and interested in the results. Near the end of the session, members discussed their plans for the upcoming weekend. Each member stated he/she would attend at least 2 AA meetings.    Summary: The patient was attentive and engaged actively in the session. She reported that she had gone to the gym ysterday and seen another group member. She had also attended the [place] meeting this morning at the [facility]. She had seen a former group member in that meeting. She admitted she is still not sleeping, but I pointed out that her working out in the evening - around 8 pm - would be very disruptive to getting to bed at 10 pm. She agreed that she would work out earlier. The patient shared about  how distant she is to her family as compared to one other member who is terribly enmeshed with his family. She admitted that she had not seen her father in almost 10 years. The patient nodded as we discussed the idea of building community and having a family that is not necessarily biological. In the psycho-ed, the patient identified quality time and acts of service. She was not surprised by this revelation and agreed to have her S/O also take the test to see how they matched up. The patient reported she would attend 2 AA meetings over the weekend, including the [employer] on Sunday at 4 pm. She would be helping her 15 yo daughter complete applications for college. The patient reported she is feeling good and will also go back to the gym. She responded well to this intervention and appeared more comfortable in group and shared about some of her issues more openly and easily than in the past. The patient remains sober with a sobriety date of 8/12.    Family Program: Family present? No   Name of family member(s):   UDS collected: No Results:   AA/NA attended?: YesThursday  Sponsor?: No, but she is seeking a temporary sponsor   Anjeli Casad, LCAS

## 2015-03-24 ENCOUNTER — Other Ambulatory Visit (HOSPITAL_COMMUNITY): Payer: 59

## 2015-03-25 LAB — TOXASSURE SELECT,+ANTIDEPR,UR: PDF: 0

## 2015-03-27 ENCOUNTER — Encounter (HOSPITAL_COMMUNITY): Payer: Self-pay | Admitting: Medical

## 2015-03-27 ENCOUNTER — Other Ambulatory Visit (HOSPITAL_COMMUNITY): Payer: Self-pay | Admitting: Medical

## 2015-03-27 ENCOUNTER — Other Ambulatory Visit (INDEPENDENT_AMBULATORY_CARE_PROVIDER_SITE_OTHER): Payer: 59 | Admitting: Psychology

## 2015-03-27 DIAGNOSIS — F4312 Post-traumatic stress disorder, chronic: Secondary | ICD-10-CM

## 2015-03-27 DIAGNOSIS — F3162 Bipolar disorder, current episode mixed, moderate: Secondary | ICD-10-CM

## 2015-03-27 DIAGNOSIS — F10239 Alcohol dependence with withdrawal, unspecified: Secondary | ICD-10-CM

## 2015-03-27 DIAGNOSIS — Z62810 Personal history of physical and sexual abuse in childhood: Secondary | ICD-10-CM

## 2015-03-27 DIAGNOSIS — F1020 Alcohol dependence, uncomplicated: Secondary | ICD-10-CM | POA: Diagnosis not present

## 2015-03-27 DIAGNOSIS — Z6372 Alcoholism and drug addiction in family: Secondary | ICD-10-CM

## 2015-03-27 MED ORDER — QUETIAPINE FUMARATE 100 MG PO TABS: 100.0000 mg | ORAL_TABLET | Freq: Every day | ORAL | Status: DC
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
## 2015-03-28 ENCOUNTER — Other Ambulatory Visit (HOSPITAL_COMMUNITY): Payer: 59

## 2015-03-29 ENCOUNTER — Encounter (HOSPITAL_COMMUNITY): Payer: Self-pay | Admitting: Psychology

## 2015-03-29 ENCOUNTER — Other Ambulatory Visit (HOSPITAL_COMMUNITY): Payer: 59 | Admitting: Psychology

## 2015-03-29 DIAGNOSIS — F3162 Bipolar disorder, current episode mixed, moderate: Secondary | ICD-10-CM

## 2015-03-29 DIAGNOSIS — F4312 Post-traumatic stress disorder, chronic: Secondary | ICD-10-CM

## 2015-03-29 DIAGNOSIS — F10239 Alcohol dependence with withdrawal, unspecified: Secondary | ICD-10-CM

## 2015-03-29 DIAGNOSIS — Z62810 Personal history of physical and sexual abuse in childhood: Secondary | ICD-10-CM

## 2015-03-29 NOTE — Progress Notes (Signed)
    Daily Group Progress Note  Program: CD-IOP   Group Time: 1-2:30 pm  Participation Level: Active  Behavioral Response: Sharing  Type of Therapy: Process Group  Topic: Group Process Session: Counselor met with patients for group processing. All patients were active and engaged in session. Counselor administered drug tests for all 4 present members. A visiting PA student from [facility] joined the group. 80 female members were not present for group today. One had called earlier in the day to inform the counselor that a close family friend had died suddenly and he would be attending a funeral in [place]. The other absent member made no effort to contact the counselor and has been unreachable since last Thursday. Members shared about challenges pertaining to their recovery. Counselor pointed out that this process time lacked emotion and encouraged patients to journal their emotional responses at work and/or home to be able to share in group. Group members shared about their experience of ambivalence to change.  Group Time: 2:45-4pm  Participation Level: Active  Behavioral Response: Appropriate  Type of Therapy: Psycho-education Group  Topic: Psycho-Ed Session: Counselor met with patient for group psycho-ed. All patients were active and engaged in session. Counselor briefly recapped last week's lesson on communication styles and asked patients to share their experience of communicating this past weekend. One member shared that she noticed herself being passive while others felt content with their style of communication. The counselor encouraged the members that being assertive in relation to their sobriety is most important. The counselor then led an exercise in the difference between communicating thoughts, feelings, and wants. Patients were asked to write down their responses to different prompts using thoughts, feelings, and wants. Patients shared responses and the counselor help the  patients process their answers together.    Summary: Patient was active and engaged in session. Patient shared about her experience of recovery and her feelings of lethargy. She described often feeling stuck on the couch and an inability to motivate herself to go to [employer]. However she did attend 1 AA meeting over the weekend, at which she met a new friend. They exchanged numbers and the patient practiced calling her new friend which she said was good. She was excited to share in group that she had made a friend and the counselor encouraged her and other members to share these emotions in group so that the group can help her process them. Patient is actively attending a gym and reported that she is enjoying the endorphins from exercise. Additionally, the patient is struggling with how to manage her daughter who has said that she "prefers her mom drunk" since the patient is now enforcing more rules around the house. Counselor helped client to express her fear and made a plan to work on this issue directly in individual counseling. Client shows good insight into her disease and wants to change. Counselor helped to clarify the difference between "feeling" like going to a meeting and just going because it's the plan she made. Counselor encouraged all patients not to trust their feelings, but instead "make a plan" and stick to it. Patient completed the communication exercise well and was able to identify different thoughts, feelings, and wants for each scenario. Patient's sobriety date remains 8/12.   Family Program: Family present? No   Name of family member(s):   UDS collected: Yes Results: pending  AA/NA attended?: YesFriday  Sponsor?: No   Jakell Trusty, LCAS

## 2015-03-30 ENCOUNTER — Encounter (HOSPITAL_COMMUNITY): Payer: Self-pay | Admitting: Psychology

## 2015-03-30 ENCOUNTER — Other Ambulatory Visit (HOSPITAL_COMMUNITY): Payer: 59 | Admitting: Psychology

## 2015-03-30 DIAGNOSIS — F3162 Bipolar disorder, current episode mixed, moderate: Secondary | ICD-10-CM

## 2015-03-30 DIAGNOSIS — Z62810 Personal history of physical and sexual abuse in childhood: Secondary | ICD-10-CM

## 2015-03-30 DIAGNOSIS — F4312 Post-traumatic stress disorder, chronic: Secondary | ICD-10-CM

## 2015-03-30 DIAGNOSIS — F10239 Alcohol dependence with withdrawal, unspecified: Secondary | ICD-10-CM

## 2015-03-30 NOTE — Progress Notes (Signed)
    Daily Group Progress Note  Program: CD-IOP   Group Time: 1-2:30 pm  Participation Level: Active  Behavioral Response: Sharing  Type of Therapy: Process Group  Topic: Counselors met with group members in process during the first half of group. Patients shared about challenges and insights related to their recovery. All patients were active and engaged in session by sharing and responding to questions posed. 62 female members were not present. One was pre-excused for a work conference and the other was absent due to unknown reasons. Counselors encouraged self-exploration and linking of patients' experiences during session. Drug test results from the previous week were returned. One member's test was positive for alcohol and the counselors confronted him about lying and encouraged him in his recovery.  Group Time: 2:45- 4pm  Participation Level: Active  Behavioral Response: Sharing  Type of Therapy: Psycho-education Group  Topic: Counselors met with group members in psycho-ed session.  Counselors continued a lesson on styles of communication and how to identify and enact healthy, assertive communication. After instructing patients on different styles of verbal communication (passive, aggressive, passive-aggressive, assertive), the counselors facilitated a variety of role-plays to help patients practice their communication skills. Patients acted out scenarios from work, relationships, as well as situations with "pressure to drink". All patients participated in role-plays. Counselor processed with patients afterward.   Summary: Patient participated in group and was active and engaged in session. Patient's drug test results came back negative. She reports that she has not attended an [place] meeting since the last meeting because she helped her daughter who is applying for college. Patient was excited to report that she joined a gym and is looking forward to getting healthy. She is becoming  more aware of her addiction through experiencing a "dry drunk" phase last week. She engaged in sleeping in, watching TV for long periods of time, and staying home all day and reported feelings of sadness even though she was not drinking. She reports that she is ready to "get to work" on treatment, however she continues to miss [employer]. She is relatively new to the area and has few friends to reach out to for help. During the psycho-ed session, patient was active and very engaged with the role plays. She seemed to enjoy learning about different communication styles and tried many different roles. She was able to identify her usually passive style and how she could work towards being more assertive in her communication. Her sobriety date remains 8/12.   Family Program: Family present? No   Name of family member(s):   UDS collected: No Results:  AA/NA attended?: No, and patient has been warned that failure to attend will result in discharge.  Sponsor?: No   Ifeanyi Mickelson, LCAS

## 2015-03-31 ENCOUNTER — Telehealth (HOSPITAL_COMMUNITY): Payer: Self-pay | Admitting: Psychology

## 2015-03-31 ENCOUNTER — Encounter (HOSPITAL_COMMUNITY): Payer: Self-pay | Admitting: Psychology

## 2015-03-31 ENCOUNTER — Other Ambulatory Visit (HOSPITAL_COMMUNITY): Payer: 59

## 2015-03-31 NOTE — Progress Notes (Signed)
    Daily Group Progress Note  Program: CD-IOP   Group Time: 1-2:30 pm  Participation Level: Active  Behavioral Response: Appropriate and Sharing  Type of Therapy: Process Group  Topic: Process/Psycho-Ed: the first 30 minutes of group was spent in process. Members shared about struggles and challenges in early recovery. After the check-in was completed, a handout on the "Wheel of Life" was passed out. Members were asked to chart their current status relative to 8 different aspects of life. After completing these, members took turns charting these on the board. A discussion ensued with members providing insight and discussing where they were and how they might improve upon their positions.   Group Time: 2:45- 4pm  Participation Level: Active  Behavioral Response: Sharing  Type of Therapy: Psycho-education Group  Topic: Psycho-ed: Sleep. The second part of group was spent in a psycho-ed on Sleep and Sleep Hygiene. Two handouts were provided, including one identifying good sleep hygiene and the other focused on the different stages of sleep. A good discussion ensued with members sharing about their own sleep habits. While some members practiced good sleep hygiene, others seemed to have little insight into any preparations for sleep.    Summary: The patient reported she had attended the [place] meeting this morning at 10 am. She had intended on going to an 8 pm AA meeting last night, but she didn't have her car. The patient explained that her daughter had borrowed the car to take her boyfriend home with the understanding that she would be back in 15 minutes, but she not returned until almost 8:30. The patient had missed the meeting and admitted that she had been very upset and she and her daughter had had a very bad argument. The patient confirmed that she and her daughter would meet with this counselor next Tuesday for a much needed family session. In the psycho-ed, the patient's wheel displayed  dissatisfaction with a number of different elements of her life. These included: Family and friends, fun and recreation and her mental health. The patient shared how she might begin to improve on these areas. I noted that her new gym membership and working out will likely improve her mental health. The patient agreed with this and admitted she feels much better when she leaves the gym. The patient had some good comments during the sleep presentation and shared how she prepares for bedtime. As the session neared the end, the patient identified her intentions around the weekend and her recovery plans. She stated she would attend the 10 am tomorrow and the [employer] on Saturday and Sunday. The patient displayed more motivation today and shared openly about her successes and struggles. Her sobriety date remains 8/14.   Family Program: Family present? No   Name of family member(s):   UDS collected: No Results:   AA/NA attended?: YesThursday  Sponsor?: No   Monica Zahler, LCAS

## 2015-04-01 LAB — TOXASSURE SELECT,+ANTIDEPR,UR: PDF: 0

## 2015-04-02 ENCOUNTER — Encounter (HOSPITAL_COMMUNITY): Payer: Self-pay | Admitting: Psychology

## 2015-04-02 NOTE — Progress Notes (Signed)
    Daily Group Progress Note  Program: CD-IOP   Group Time: 1-2:30 pm  Participation Level: Active  Behavioral Response: Sharing  Type of Therapy: Process Group  Topic: Group Process Session: The first part of group was spent in process with members discussing what they have been doing to support their recovery. They also shared about challenges in early recovery. Counselor encouraged [place] meeting attendance and the importance of maintaining a full, predictable weekly schedule written in a schedule book. Counselor encouraged patient honesty when reporting on their recovery. Counselor also asked patients to note their feelings throughout the day and to report on any emotional or psychological conflicts that occur.   Group Time: 2:45-4 pm  Participation Level: Active  Behavioral Response: Appropriate  Type of Therapy: Psycho-education Group  Topic: Psycho-Ed Session/Guest speaker: Counselor met with patients in the second half of group for a psycho-ed. During this time, a staff chaplain taught a lesson on life values. Patients were handed a "values chart" on which they selected their top values. The chaplain and counselor used open questions to encourage client exploration of their values and how they could live out their values better. The counselor discussed how all of the patient's values hinge on the value of sobriety in recovery. Patients were receptive and actively participated during the values sort and the process time afterward. Patients were given a homework assignment of finding 2-3 mins a day to reflect on their values.   Summary: Patient was active and engaged in group. She presented as more tired than days past with slower speech. She shared that she attended 1 AA meeting at the [employer]. Patient has joined a gym and is working out regularly 2-3 times per week. She reports that she is trying to fill her schedule book and stick to it no matter how she feels about the  activities. Patient suffers from intense lack of motivation due to her old drinking behaviors of sitting around the house and watching TV all day. She has maintained sobriety and will pick up her 60 day chip tomorrow. However, she is still in danger of engaging in "dry drunk" behaviors of her past. Counselor encouraged client to "act as if." and that behaviors create cognitive change during early recovery. Patient shared that her top values were family and stability but later in session she changed her values to sobriety and family because "sobriety has to come first". Patient seems to have increased her awareness of her disease and her depression and how it can detract from her recovery. Patient's sobriety date remains 8/12.   Family Program: Family present? No   Name of family member(s):   UDS collected: No Results:   AA/NA attended?: [place]  Sponsor?: No   Michi Herrmann, LCAS

## 2015-04-03 ENCOUNTER — Other Ambulatory Visit (HOSPITAL_COMMUNITY): Payer: 59 | Admitting: Psychology

## 2015-04-03 DIAGNOSIS — F3162 Bipolar disorder, current episode mixed, moderate: Secondary | ICD-10-CM

## 2015-04-03 DIAGNOSIS — F10239 Alcohol dependence with withdrawal, unspecified: Secondary | ICD-10-CM

## 2015-04-03 DIAGNOSIS — F1020 Alcohol dependence, uncomplicated: Secondary | ICD-10-CM | POA: Diagnosis not present

## 2015-04-04 ENCOUNTER — Encounter (HOSPITAL_COMMUNITY): Payer: Self-pay | Admitting: Psychology

## 2015-04-04 ENCOUNTER — Other Ambulatory Visit (HOSPITAL_COMMUNITY): Payer: 59

## 2015-04-04 NOTE — Progress Notes (Signed)
    Daily Group Progress Note  Program: CD-IOP   Group Time: 1-2:30 pm  Participation Level: Active  Behavioral Response: Appropriate and Sharing  Type of Therapy: Process Group  Topic: CD-IOP Group Process Session: Counselor met with group members and engaged in a process session. Counselor encouraged patients to share about their recovery and any challenges they experienced since meeting last. Patients shared about attendance in [place] as well as difficult emotional experiences they have dealt with recently. One new member was present today. He shared his sobriety date with the group. He met with the program director for most of this session. Drug test results were handed back to patients and all results were negative.   Group Time: 2:45- 4pm  Participation Level: Active  Behavioral Response: Appropriate  Type of Therapy: Psycho-education Group  Topic: CD-IOP Group Psycho-Ed Session: In the second half of group, counselor met with patients for a psycho-ed. One new member was present for this session.  He shared briefly about his addiction to alcohol, recent treatment at a residential facility, and his brother's recent and sudden death that sparked a relapse. Counselor discussed the significance of acquiring an AA sponsorship and home group. Patients had questions around how to get a sponsor and what the term "home group" meant. Additionally, counselor shared an excerpt about "Surrendering" from a book on recovery. Patients shared about what surrendering meant to them. Many reported that surrendering is difficult since they associate it with "defeat".    Summary: Patient was active and engaged during session. She reported significant migraines from the past weekend which kept her at home and away from the gym. Patient reported that she saw a work friend in public which was a positive experience. The friend asked when the patient was going to return and that people at the patient's work  missed her. The patient reported that this made her feel loved and appreciated. Patient compared it to her past attempts at treatment in which her work friends made her feel "uncomfortable" which led to her getting drunk. She has been working with her [employer] to confirm her return to work date which is set at Nov. 7. Patient also reported that she has surrendered completely and knows that she never wants to drink again. She continues to show a high level of motivation, and must continue to enact behaviors that promote recovery. The patient responded well to this intervention and her sobriety date remains 8/12.     Family Program: Family present? No   Name of family member(s):   UDS collected: No Results:   AA/NA attended?: Yes, Monday  Sponsor?: No   Chanah Tidmore, LCAS

## 2015-04-04 NOTE — Progress Notes (Signed)
Stacy Pena is a 49 y.o. female patient. CD-IOP: Individual Counseling Session. Counselor met with the patient this morning as scheduled. She reported still struggling with a sinus headache/migraine that kept her down all weekend. The patient noted she is scheduled to see her PCP this afternoon and will explore options, including a referral to an ENT. In reviewing her treatment goals, I noted she is making good progress in her recovery. The patient has been sober since 8/12 and very recently picked up her 60 day chip. I applauded her progress in sobriety, but wondered about meeting more women in recovery at meetings? The patient reported she intended to go to some new meetings. A current schedule of AA meetings was reviewed along with one for [place]. She works in [place] and will need to find some meetings to attend there at lunch or possibly immediately after work. She hi-lited meetings for each day of the week and identified a number of women's only meetings she could attend. The patient also reported she is planning on going to the gym and then attending a meeting tonight in [place] with a fellow group member. Regarding her third treatment goal, which is to become healthier, she has joined a gym and reported in group that she feels so much better when she leaves the gym. "I don't want to drink". When asked what she ate this morning for breakfast, the patient admitted she had not eaten anything. I implored her to feed herself and she agreed to go to [facility] and buy some bananas and granola bars so she can at least get a little something into her system. The patient agreed that diet is important, but she is doing better overall. We talked briefly about the session scheduled for tomorrow morning with her 7 yo daughter, [name]. The patient discussed what she wanted to address in this first session, which included the disease concept of addiction and the importance of being accountable for both herself and  her daughter. The patient's daughter is angry about a lot of things and her mother has been overly permissive out of guilt over the times she was drinking or the month she was in treatment at [facility]. We agreed to a plan and the patient will return tomorrow at 9:30 am with her daughter for a family session. She is making good progress relative to her many years of alcoholism and the fact that the inpatient and now this program are both firsts for her. We will continue to follow closely in the days ahead. The patient's sobriety date remains 8/12.        Stacy Pena, LCAS

## 2015-04-05 ENCOUNTER — Encounter (HOSPITAL_COMMUNITY): Payer: Self-pay | Admitting: Medical

## 2015-04-05 ENCOUNTER — Other Ambulatory Visit (HOSPITAL_COMMUNITY): Payer: Self-pay | Admitting: Medical

## 2015-04-05 ENCOUNTER — Other Ambulatory Visit (HOSPITAL_COMMUNITY): Payer: 59 | Admitting: Psychology

## 2015-04-05 DIAGNOSIS — F1020 Alcohol dependence, uncomplicated: Secondary | ICD-10-CM | POA: Diagnosis not present

## 2015-04-05 DIAGNOSIS — K852 Alcohol induced acute pancreatitis without necrosis or infection: Secondary | ICD-10-CM

## 2015-04-05 DIAGNOSIS — I158 Other secondary hypertension: Secondary | ICD-10-CM

## 2015-04-05 DIAGNOSIS — Z6372 Alcoholism and drug addiction in family: Secondary | ICD-10-CM

## 2015-04-05 DIAGNOSIS — F319 Bipolar disorder, unspecified: Secondary | ICD-10-CM

## 2015-04-05 DIAGNOSIS — Z62810 Personal history of physical and sexual abuse in childhood: Secondary | ICD-10-CM

## 2015-04-05 DIAGNOSIS — F5104 Psychophysiologic insomnia: Secondary | ICD-10-CM | POA: Insufficient documentation

## 2015-04-05 MED ORDER — ARIPIPRAZOLE 30 MG PO TABS: 30.0000 mg | ORAL_TABLET | Freq: Every day | ORAL | Status: DC

## 2015-04-05 MED ORDER — TOPIRAMATE 100 MG PO TABS: ORAL_TABLET | ORAL | Status: DC

## 2015-04-05 MED ORDER — QUETIAPINE FUMARATE 100 MG PO TABS: ORAL_TABLET | ORAL | Status: DC

## 2015-04-05 NOTE — Progress Notes (Signed)
Patient ID: Stacy Pena, female   DOB: Jan 17, 1966, 49 y.o.   MRN: [id number]  S- Pt seen for FU of TopIRAimate rx for intractable Mood Disorder replacing seroquel to reduce risk of Type 2 Diabetes from medication.She was unable to completely stop Seroquel and insomnia persisted so shwe was started on HS taper of Seroquel. Today she reports 150 mg of Topiramate has stabilized mood. She c/o Trazodone not "making her sleepy" but she does get REM sleep now and no awakening.She has remained abstinent since last slip.  O- WELL GROOMED;ALERT;ORIENTED;APPROPRIATE;COMPREHENSIBLE      UDS CLEAR  A Responding to treatment of her Dual Diagnosis.See visit diagnoses  P-Rx Topiramate 150 mg QD Decrease Seroquel to 50 mg HS;Review Sleep Hygiene materials;FU PRN

## 2015-04-06 ENCOUNTER — Other Ambulatory Visit (HOSPITAL_COMMUNITY): Payer: 59 | Admitting: Licensed Clinical Social Worker

## 2015-04-06 DIAGNOSIS — F1020 Alcohol dependence, uncomplicated: Secondary | ICD-10-CM | POA: Diagnosis not present

## 2015-04-07 ENCOUNTER — Ambulatory Visit
Admission: RE | Admit: 2015-04-07 | Discharge: 2015-04-07 | Disposition: A | Discharge status: Home / Self Care | Payer: 59 | Source: Ambulatory Visit

## 2015-04-07 ENCOUNTER — Encounter (HOSPITAL_COMMUNITY): Payer: Self-pay | Admitting: Psychology

## 2015-04-07 ENCOUNTER — Other Ambulatory Visit (HOSPITAL_COMMUNITY): Payer: 59

## 2015-04-07 ENCOUNTER — Encounter (HOSPITAL_COMMUNITY): Payer: Self-pay | Admitting: Licensed Clinical Social Worker

## 2015-04-07 DIAGNOSIS — Z1231 Encounter for screening mammogram for malignant neoplasm of breast: Secondary | ICD-10-CM

## 2015-04-07 NOTE — Progress Notes (Signed)
    Daily Group Progress Note  Program: CD-IOP   Group Time: 1-2:30 pm  Participation Level: Active  Behavioral Response: Appropriate and Sharing  Type of Therapy: Process Group  Topic: CD-IOP Group Process Session: Counselor met with patients for 1.5 hr group process session. Drug tests were administered to all patients. One patient, who has completed orientation and reported that he would be in session today, was absent for the 2nd time. Two patients shared about relapsing last night. Both presented as disappointed and remorseful. Counselor encouraged all patients to utilize the process time to explore deeper emotions since they can be triggers to use drugs/alcohol. Patients shared about challenges related to their recovery.  Group Time: 2:45- 4pm  Participation Level: Active  Behavioral Response: Appropriate and Sharing  Type of Therapy: Psycho-education Group  Topic: CD-IOP Group Psycho-Ed Session: Counselor met with patients for 1.5 hr group psycho-ed session. Counselor presented on the disease concept of addiction. Counselor discussed the terms "chronic", "progressive", and "incurable" to describe the patient's addiction. Counselor used questions to assess patients' comfort level with labeling their addiction as "chronic" or a "disease". Most admitted their addiction was chronic and progressive. Counselor confronted one group member who relapsed for the 3rd time since entering the CD-IOP, and informed her that she would need to be referred to a higher level of care. She appeared surprised by this and upset. She reminded the counselor of how much better she has been doing and suggested there was a difference between a slip and a relapse. The counselor processed her immediate feelings of fear and frustration with the group.    Summary: Patient presented in group as active and engaged. She reports having sinus issues since the break and feeling slightly sick. She attended a work-out  session on Tuesday and an [place] with another group member later that evening. She continues to show a strong sense of ownership of her addiction and honesty about her recovery. She shared with the group that she refused to accept her disease as chronic and progressive until her primary care physician showed her some charts with her blood work. Patient liver and health has suffered significantly and she reports that her doctor helped convince her of the disease of alcohol. Patient reported that she enjoyed a family counseling session with her daughter and the counselor earlier that day. She felt pleased with the result. Patient saw program director for portion of psycho-ed group. Patient's sobriety date remains 8/12.    Family Program: Family present? No   Name of family member(s):   UDS collected: Yes Results: pending  AA/NA attended?: YesTuesday and Wednesday  Sponsor?: No   Cedrick Partain, LCAS

## 2015-04-07 NOTE — Progress Notes (Signed)
    Daily Group Progress Note  Program: CD-IOP  Group Time: 1-2:30  Participation Level: Active  Behavioral Response: Appropriate and Sharing  Type of Therapy:  Process Group  Summary of Progress: The first part of group was spent in process where group members shared about obstacles and challenges since the last group. There was good disclosure and feedback among group members. One new member joined the group today and introduced himself to the group. Group members also shared any urges or cravings they had experienced. TC   Group Time: 2;45-4  Participation Level:  Active  Behavioral Response: Appropriate and Sharing  Type of Therapy: Psycho-education Group  Summary of Progress: The second part of group focused on The Serenity Prayer, a guideline to the philosophy of living. The group discussed control - what can be controlled and what cannot be controlled. Each group member completed a handout listing things you can change and things you cannot change. Group members engaged in a productive discussion.Patient reported she went to a meeting last night in [place] and then went to the gym. She received a pay-out check for her disability which is a trigger for her bi-polar. She immediately went shopping and filled up her cart. She was able to get her mania under control and put everything back on the shelf. She reports she is continuing to take her medications as prescribed and has learned coping tools to deal with her mania. Patient reported she is taking her Campral and it has helped immensely with her alcohol cravings. She completed the assignment by identifying things she has control over and can change: attitude, actions, reactions, feelings. She was actively engaged in the discussion and benefitted from the intervention. Her sobriety date is 8/12.  Jett Kulzer S, Licensed Cli

## 2015-04-10 ENCOUNTER — Other Ambulatory Visit (HOSPITAL_COMMUNITY): Payer: Self-pay | Admitting: Medical

## 2015-04-10 ENCOUNTER — Other Ambulatory Visit (HOSPITAL_COMMUNITY): Payer: 59 | Admitting: Licensed Clinical Social Worker

## 2015-04-10 ENCOUNTER — Encounter (HOSPITAL_COMMUNITY): Payer: Self-pay | Admitting: Medical

## 2015-04-10 DIAGNOSIS — Z6372 Alcoholism and drug addiction in family: Secondary | ICD-10-CM

## 2015-04-10 DIAGNOSIS — F4312 Post-traumatic stress disorder, chronic: Secondary | ICD-10-CM

## 2015-04-10 DIAGNOSIS — F319 Bipolar disorder, unspecified: Secondary | ICD-10-CM

## 2015-04-10 DIAGNOSIS — F1020 Alcohol dependence, uncomplicated: Secondary | ICD-10-CM

## 2015-04-10 DIAGNOSIS — Z62810 Personal history of physical and sexual abuse in childhood: Secondary | ICD-10-CM
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
## 2015-04-11 ENCOUNTER — Encounter (HOSPITAL_COMMUNITY): Payer: Self-pay | Admitting: Psychology

## 2015-04-11 ENCOUNTER — Other Ambulatory Visit (HOSPITAL_COMMUNITY): Payer: 59

## 2015-04-11 NOTE — Progress Notes (Unsigned)
Stacy Pena is a 49 y.o. female patient. CD-IOP: Family Member Counseling Session. I met with the patient's daughter this morning. I had met with she and her mother, the patient, last Tuesday morning and her daughter, Stacy Pena, had asked to meet with me by herself. Today, when asked how things were going at home since our session last week, the patient described the things her mother was doing and how she was encouraging her to go to the gym and [place] meetings.  I pointed out that I was wondering how she was doing since last week and what her interaction with her mother was like? The patient reported, "she is much more pleasant now. It's a big difference", she explained. The patient explained she is much more relaxed and comfortable at the house now that her mother isn't drinking. We discussed how her mother's drinking has affected her young life. The patient admitted, "I have taken care of her for a really long time". I reminded this 49 yo that her job was not to take care of her mother, but rather for her mother to take care of her. 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 I wondered if she would worry about her mother and if she would be distracted being away from her? She admitted that she probably would be. I wondered if the three of [place] could meet next week and she could tell her mother how she feels about her sobriety? Alexis agreed that she would like to do that and that she had some strong things to say to her mother. We discussed this at length and I explained that Stacy Pena' job is to seek the school work that resonates with her and work hard and begin to build her life. It is important that she focus on her own life and not worry about her mother - "your mother knows what to do to take care of herself and she will do that if she wants to. I reminded Stacy Pena that she can't change her mother nor should she spend any more  time after years of trying to do just that. She seemed pleased and relieved to hear this. The patient's daughter was very open about her feelings and her experiences with her mother and is clearly very happy that her mother is more fun, happier and more active. "I like her a lot when she is sober", her daughter reported. The session went well and she left with a growing understanding that she is responsible for her life and no longer needs to feel responsible for her mother. She responded well to this intervention and we will tentatively meet next Tuesday at 9:30 for a family session.         Nina Mondor, LCAS

## 2015-04-12 ENCOUNTER — Encounter (HOSPITAL_COMMUNITY): Payer: Self-pay | Admitting: Psychology

## 2015-04-12 ENCOUNTER — Other Ambulatory Visit (HOSPITAL_COMMUNITY): Payer: 59

## 2015-04-12 ENCOUNTER — Encounter (HOSPITAL_COMMUNITY): Payer: Self-pay | Admitting: Licensed Clinical Social Worker

## 2015-04-12 DIAGNOSIS — F1024 Alcohol dependence with alcohol-induced mood disorder: Secondary | ICD-10-CM

## 2015-04-12 DIAGNOSIS — F3162 Bipolar disorder, current episode mixed, moderate: Secondary | ICD-10-CM

## 2015-04-12 LAB — TOXASSURE SELECT,+ANTIDEPR,UR: PDF: 0

## 2015-04-12 NOTE — Progress Notes (Signed)
    Daily Group Progress Note  Program: CD-IOP   Group Time: 1-2:30  Participation Level: Active  Behavioral Response: Appropriate and Sharing  Type of Therapy: Process Group  Topic: The first part of group was spent in process where group members shared about obstacles and challenges since the last group. There was good disclosure and feedback among group members. One new member joined the group today and introduced herself to the group. Group members also shared any urges or cravings they had experienced. The group was also led in a guided imagery meditation to become fully present.    Group Time: 2:45-4  Participation Level: Active  Behavioral Response: Appropriate and Sharing  Type of Therapy: Psycho-education Group  Topic:The second part of group focused on how to manage anger successfully. Group members engaged in a productive discussion identifying physical, mental and behavioral warning signs of anger. They completed a worksheet "Recognizing Anger Triggers."       Summary: Patient reported she went to the women's meeting at the [employer] Saturday and went to the meeting at the [facility] this morning. Patient presented brighter and her mood appeared elevated -back to herself. She was sick last week. Patient reported she had several thoughts over the weekend but no cravings. Patient admits Campral is working well for her. Patient reported on her anger triggers: disrespect, unreliability, and rudeness. Patient was actively engaged in the intervention. Her sobriety date remains 8/12.    Family Program: Family present? No   Name of family member(s):   UDS collected: No Results:   AA/NA attended?: [place]  Sponsor?: No   MACKENZIE,LISBETH S, Licensed Cli

## 2015-04-12 NOTE — Progress Notes (Incomplete)
° ° °  Daily Group Progress Note  Program: CD-IOP   Group Time: 1-2:30pm  Participation Level: Active  Behavioral Response: Appropriate and Sharing  Type of Therapy: Psycho-education Group  Topic:  Counselor met with patients for 1.5 hour group psycho-ed session. A pharmacist from [facility] presented on drugs/alcohol and their effects on the body. She discussed proper and illicit use of prescription pills, the dangers of prescription pills, and medications to manage addiction. Patients listened actively and some had questions about their specific medications. Counselor asked clients to identify their personal recovery behaviors, in addition to medication.   Group Time: 2:45-4pm  Participation Level: Active  Behavioral Response: Appropriate and Sharing  Type of Therapy: Process Group  Topic: Counselor met with patients for 1.25 hour group process session. Patients shared about challenges related to their recovery and any behaviors that contributed to them staying sober. Counselor used open questions to invite members to share openly. One patient was missing. His absence is excused. All group members were active and engaged in session.   Summary: Patient reported on her continued recovery behaviors including working out, sleeping on a schedule, filling her day with activities and recording them in a planner, and attending [place] meetings. Patient is maintaining sobriety and has a healthy level of openness and honesty in group. Counselor encouraged her progress during group. Patient was encouraged to find a temporary sponsor soon since she is getting more comfortable with attending meetings. She disclosed that when she first arrived at CD-IOP she did not want to attend AA but that it has made a big difference for her this time. Patient's sobriety remains 8/12. (Wes Earnie Larsson Intern)   Family Program: Family present? No   Name of family member(s):   UDS collected: No Results:   AA/NA  attended?: [place]  Sponsor?: No   EVANS,ANN, LCAS

## 2015-04-13 ENCOUNTER — Other Ambulatory Visit (HOSPITAL_COMMUNITY): Payer: 59 | Admitting: Psychology

## 2015-04-13 DIAGNOSIS — F1020 Alcohol dependence, uncomplicated: Secondary | ICD-10-CM

## 2015-04-13 DIAGNOSIS — K852 Alcohol induced acute pancreatitis without necrosis or infection: Secondary | ICD-10-CM

## 2015-04-13 DIAGNOSIS — Z62810 Personal history of physical and sexual abuse in childhood: Secondary | ICD-10-CM

## 2015-04-13 DIAGNOSIS — F319 Bipolar disorder, unspecified: Secondary | ICD-10-CM

## 2015-04-14 ENCOUNTER — Encounter (HOSPITAL_COMMUNITY): Payer: Self-pay | Admitting: Psychology

## 2015-04-14 ENCOUNTER — Other Ambulatory Visit (HOSPITAL_COMMUNITY): Payer: 59

## 2015-04-14 MED ORDER — QUETIAPINE FUMARATE 100 MG PO TABS: 200.0000 mg | ORAL_TABLET | Freq: Every day | ORAL | Status: DC

## 2015-04-14 NOTE — Progress Notes (Signed)
    Daily Group Progress Note  Program: CD-IOP   Group Time: 1-2:30 pm  Participation Level: Active  Behavioral Response: Appropriate and Sharing  Type of Therapy: Process Group  Topic: Process/Meditation/check-in: the first part of group was spent in process. Members checked in with sobriety dates and then the group engaged in a 10 minute meditation. All members agreed that the meditation was relaxing - some more than others. Drug test results were returned and one member's test was positive for alcohol, which he had admitted to, but also for [facility], which he had not previously shared. Group spent some time processing his resistance towards being completely honest and addressing the shame that he felt. The remainder of this half of group was spent in members sharing about what they had done since yesterday's session and the things they had done to support or strengthen their sobriety.   Group Time: 2:45- 4pm  Participation Level: Active  Behavioral Response: Sharing  Type of Therapy: Psycho-education Group  Topic: Feelings: What are they, why you should become aware of them and what you do when you experience an unpleasant feeling? The second half of group was spent in a psycho-ed on Feelings. Members were asked to draw on the board and answer the questions listed on the board. They did a good job identifying what feelings are and why they are importance to become aware of. The significance that feelings play in addiction and relapse was discussed at length. Members were asked to list what they frequently, other than drink/drug, to deal with unpleasant or uncomfortable feelings. The discussion was lively with good disclosure among group members.    Summary: The patient reported she had not attended any meetings but had rested yesterday. She did text another woman in recovery, but she had had a big argument with her daughter about the car and it was very upsetting for her. The patient agreed  with another member who wondered if she was worried about when her daughter goes to college next fall and she is alone? The patient reported that "when she leaves, it's just me". When asked about how she was feeling, the patient reported she feels more than just tired and wondered if she is growing depressed? In the second half of group, the patient identified "anger' as a difficult feeling for her and explained that she now works towards addressing or confronting the anger and reaching a compromise versus her old behavior which is to drink. She reported she would be attending some women's meetings this weekend. The patient made some good comments, but her lack of energy/depression is problematic, especially considering she intends to return to full-time employment in less than 2 weeks. Her sobriety date remains 8/12.    Family Program: Family present? No   Name of family member(s):   UDS collected: No Results:   AA/NA attended?: No  Sponsor?: No   Pennie Vanblarcom, LCAS

## 2015-04-14 NOTE — Progress Notes (Signed)
Stacy Pena is a 49 y.o. female patient. CD-IOP: Treatment Plan Update. Counselor met with the patient this morning for her weekly individual session and treatment plan update. When asked about the 10 am AA meeting today, the patient admitted she had not attended the meeting because her daughter had been running late to high school and she had to take her around 10:15. She lives in [place] and to get here from her home takes about 20 minutes. The patient admitted she had also missed the AA meeting last night because she had to take her daughter shopping for her [place]. I expressed concern about her recent absences from meetings and wondered if she was becoming complacent? The patient denied this, but admitted she feels as if she is a passenger in her daughter's life. I reminded her that she is the parent and she should not allow a 39 yo to run her life. The three of [place] are scheduled to meet on Tuesday morning for another family session and we agreed to discuss this further in that session. In conducting the treatment plan review, I pointed out she is doing really well relative to her sobriety date and has remained sober since entering the [facility] back in August. The patient stated she would attend the 11 am women's meeting tomorrow and she had a sponsor in mind that is usually at that meeting. She also noted that her S/O is coming up from [place] and she will attend the meeting with her. In reviewing her third treatment goal, the patient has made some progress in becoming healthier in her daily life, including attending the gym and improving her diet, but she admitted that in the past couple of days she has craved sugar and fast food. I reminded her that these changes occur in increments and she won't be perfect in a week, but these are things she must continue to address. I asked about her medications and the patient reported she is taking everything as prescribed. When asked if she had  secured a medication box to put her daily medications in, the patient admitted she had not done that and showed me her purse, which was full of pill bottles. I reminded her that the only medication she even takes in the middle of the day is Campral - the others are all morning or night, when she is home. The patient reported she would buy some containers this weekend. The patient admitted she feels tired and isn't sure if it is her depression or just working out at the gym. I wondered if she felt confident about returning to work on the 7th of November. The patient reported she was getting bored and thought it would be good to go back to work. It was pointed out that perhaps she is bored because she hasn't attended any AA meetings in 4 days, gone for a walk or been to the gym. Despite my concerns, the patient seems determined to return to full-time employment in 11 days. The patient has only attended 14 sessions and intends to leave the program after just 17 sessions. She will not have completed enough to be considered a successful graduate, but she is not here with any requirements from employment so they will welcome her back as soon as she can get here. WE will follow her for the remainder of her time here - including 3 more group sessions. The patient has requested a referral to the [facility] in [place] where her  office is and the consent was signed and documentation will be sent accordingly. The [place] plan update was completed and the patient remains with a sobriety date of 8/12.        Roxane Puerto, LCAS

## 2015-04-16 LAB — TOXASSURE SELECT,+ANTIDEPR,UR: PDF: 0

## 2015-04-17 ENCOUNTER — Other Ambulatory Visit (HOSPITAL_COMMUNITY): Payer: 59 | Admitting: Psychology

## 2015-04-17 DIAGNOSIS — F1020 Alcohol dependence, uncomplicated: Secondary | ICD-10-CM

## 2015-04-17 DIAGNOSIS — Z62810 Personal history of physical and sexual abuse in childhood: Secondary | ICD-10-CM

## 2015-04-17 DIAGNOSIS — F319 Bipolar disorder, unspecified: Secondary | ICD-10-CM

## 2015-04-18 ENCOUNTER — Other Ambulatory Visit (HOSPITAL_COMMUNITY): Payer: 59 | Attending: Psychiatry

## 2015-04-19 ENCOUNTER — Other Ambulatory Visit (HOSPITAL_COMMUNITY): Payer: 59 | Admitting: Psychology

## 2015-04-19 ENCOUNTER — Encounter (HOSPITAL_COMMUNITY): Payer: Self-pay | Admitting: Psychology

## 2015-04-19 DIAGNOSIS — F1020 Alcohol dependence, uncomplicated: Secondary | ICD-10-CM

## 2015-04-19 DIAGNOSIS — F319 Bipolar disorder, unspecified: Secondary | ICD-10-CM

## 2015-04-19 NOTE — Progress Notes (Addendum)
    Daily Group Progress Note  Program: CD-IOP   Group Time: 1-2:30 pm  Participation Level: Active  Behavioral Response: Sharing  Type of Therapy: Process Group  Topic: Process: The first half of group was spent in process. Prior to check-in, everyone present engaged in a 3 minute meditation. The meditation was led by a computer program called "calm.com". All of the members agreed that it was relaxing for them. During this half of group, group members shared about the past weekend and the various things they did to support and strengthen their recovery. Also present today was a new member. She introduced herself and shared about her struggles with addiction and recent return home from a residential treatment center in [place]. Random drug tests were collected from a few members.   Group Time: 2:45- 4pm  Participation Level: Active  Behavioral Response: Sharing  Type of Therapy: Psycho-education Group  Topic:Walking Meditation/Psycho-ed; the second half of group started with a walking meditation outside in the [employer]. Members followed counselor around the garden, walking slowly in a line and not speaking to anyone. The group spent 10 minutes processing their experience and then returned to group room. A handout on Boredom was provided and members discussed some of the challenges in early recovery, including boredom. The handout included listing some activities that members were considering and these were discussed at length as they shared them with the group.     Summary: The patient reported she had had a friend visit this weekend and the friend had gotten sick after dinner on Friday night. The patient reported she had spent most of the weekend hanging out at home with her sick friend. When asked about going to the Women's Meetings at the [employer] on Saturday and Sunday, the patient admitted she hadn't gone.  When I reminded her that she had assured me she would go to both the 11  am and 4 pm when we last met on Friday, she shook her head and despite having told me she would, the patient admitted that she didn't. The patient admitted she is struggling with a lack of motivation. The Counselor wondered if perhaps going back to work next week isn't a little too soon. The patient denied that it was to soon and admitted she was excited at the thought of going back to work and admitted her disability income has been much less than her normal paycheck and she will be pleased to get back into the swing of things. In the psycho-ed, the patient reported she had enjoyed the walking meditation and liked being outside. She identified the things she wanted to explore to avoid boredom as getting back to the gym and also exploring some art classes, specifically, pottery. The patient seems to be in denial about her condition and it does not appear to this counselor that she is ready to return to full-time employment. She has lost ground on her recovery in the last week having attended only 1 meeting. She appears complacent and it seems likely that remaining sober, engaging in good self-care and working full-time will prove to be much more difficult than she is anticipating.  The patient's sobriety date remains 8/12.    Family Program: Family present? No   Name of family member(s):   UDS collected: No Results:   AA/NA attended?: No  Sponsor?: No   Antrice Pal, LCAS

## 2015-04-20 ENCOUNTER — Other Ambulatory Visit (HOSPITAL_COMMUNITY): Payer: 59 | Admitting: Licensed Clinical Social Worker

## 2015-04-20 ENCOUNTER — Encounter (HOSPITAL_COMMUNITY): Payer: Self-pay | Admitting: Psychology

## 2015-04-20 DIAGNOSIS — F1020 Alcohol dependence, uncomplicated: Secondary | ICD-10-CM

## 2015-04-20 DIAGNOSIS — F319 Bipolar disorder, unspecified: Secondary | ICD-10-CM

## 2015-04-21 ENCOUNTER — Encounter (HOSPITAL_COMMUNITY): Payer: Self-pay | Admitting: Licensed Clinical Social Worker

## 2015-04-21 ENCOUNTER — Other Ambulatory Visit (HOSPITAL_COMMUNITY): Payer: 59
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
## 2015-04-24 ENCOUNTER — Telehealth (HOSPITAL_COMMUNITY): Payer: Self-pay | Admitting: *Deleted

## 2015-04-24 ENCOUNTER — Other Ambulatory Visit (HOSPITAL_COMMUNITY): Payer: Self-pay | Admitting: Medical

## 2015-04-24 ENCOUNTER — Other Ambulatory Visit (HOSPITAL_COMMUNITY): Payer: 59

## 2015-04-24 ENCOUNTER — Encounter (HOSPITAL_COMMUNITY): Payer: Self-pay | Admitting: Psychology

## 2015-04-24 DIAGNOSIS — F3162 Bipolar disorder, current episode mixed, moderate: Secondary | ICD-10-CM

## 2015-04-24 DIAGNOSIS — F1020 Alcohol dependence, uncomplicated: Secondary | ICD-10-CM

## 2015-04-24 MED ORDER — QUETIAPINE FUMARATE 100 MG PO TABS: ORAL_TABLET | ORAL | Status: DC

## 2015-04-24 NOTE — Telephone Encounter (Signed)
Prescription sent to pharmacy -take 100 mg in am and 100mg  at bedtime

## 2015-04-24 NOTE — Progress Notes (Signed)
    Daily Group Progress Note  Program: CD-IOP   Group Time: 1-2:30 pm  Participation Level: Active  Behavioral Response: Appropriate and Sharing  Type of Therapy: Process Group  Topic: Counselor met with patients for group process session. All patients were active and engaged. Counselor led group in processing their experience of recovery and any challenges or successes they faced. Counselor used open questions and linked members' experiences of recovery. Drug tests were collected from some members. Some members met with Program Director to discuss medication management.   Group Time: 2;45- 4pm  Participation Level: Active  Behavioral Response: Sharing  Type of Therapy: Psycho-education Group  Topic: Counselor met with patients for group psycho-ed session. All patients were active and engaged. Counselor led group in 10 min body scan guided meditation and processed activity. Additionally, counselor led group in relapse prevention skills through discussing external triggers to use. Counselor distributed a worksheet and patients identified external triggers and how they affect patient's recovery.   Summary: Patient is making good progress in treatment but still expresses ambivalence about changing her behaviors. Counselor helped patient to establish that she struggles with motivation for her daily activities and one way to avoid getting stuck is to set a schedule and write it down in a planner. Patient agreed but reported that she struggles because she relies on her feelings about something when she makes decisions. She reported that she attended the [place] meeting at [employer] school which is an Event organiser. She enjoyed the session and saw another group member there. Patient reported that she and her daughter are arguing about who can use their one car. Counselor encouraged patient to set the car schedule and stick to it since her recovery is most important. Patient is returning to work  next week and plans to discharge from the group after the next session. Patient's sobriety date is 8/12. Youlanda Roys)   Family Program: Family present? No   Name of family member(s):   UDS collected: No Results:   AA/NA attended?: [place]  Sponsor?: No   Aman Bonet, LCAS

## 2015-04-25 ENCOUNTER — Other Ambulatory Visit (HOSPITAL_COMMUNITY): Payer: 59

## 2015-04-26 ENCOUNTER — Encounter (HOSPITAL_COMMUNITY): Payer: Self-pay | Admitting: Medical

## 2015-04-26 ENCOUNTER — Other Ambulatory Visit (HOSPITAL_COMMUNITY): Payer: 59 | Admitting: Medical
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
## 2015-04-26 NOTE — Progress Notes (Signed)
Stacy Pena is a 49 y.o. female patient. CD-IOP: Discharge. The patient is discharged from the program today. She has opted to return to full-time employment on Monday, the 7th, and facilitated her return with the assistance of her PCP. The patient did not consult with her counselor or this treatment team and the treatment team believes she is leaving prematurely. She has made some progress while enrolled in the program and has remained sober, but she has become complacent and has dropped some of the meetings and gym visits she was engaged in for weeks prior. She had requested a referral to the [facility] in [place] because it was close to her work and she could secure counseling and med management at the same place. Unfortunately, a call to them revealed that they do not take her insurance. The patient is scheduled to meet with Dr. De Nurse at [facility] in [place] for medication management. Documentation has been faxed to this facility. She leaves with a clear understanding of the things she must do and tools she will use to remain sober, but it is imperative that she continue these new behaviors and avoid the complacency and isolation that has, historically, led to her drinking.         Himmat Enberg, LCAS

## 2015-04-27 ENCOUNTER — Other Ambulatory Visit (HOSPITAL_COMMUNITY): Payer: 59

## 2015-04-28 ENCOUNTER — Other Ambulatory Visit (HOSPITAL_COMMUNITY): Payer: 59

## 2015-05-05 ENCOUNTER — Other Ambulatory Visit (HOSPITAL_COMMUNITY): Payer: Self-pay | Admitting: Medical

## 2015-05-16 ENCOUNTER — Ambulatory Visit (HOSPITAL_COMMUNITY): Payer: 59 | Admitting: Psychiatry

## 2015-05-27 ENCOUNTER — Other Ambulatory Visit (HOSPITAL_COMMUNITY): Payer: Self-pay | Admitting: Medical

## 2015-06-02 ENCOUNTER — Other Ambulatory Visit (HOSPITAL_COMMUNITY): Payer: Self-pay | Admitting: Medical

## 2015-06-10 ENCOUNTER — Other Ambulatory Visit (HOSPITAL_COMMUNITY): Payer: Self-pay | Admitting: Medical

## 2015-06-26 ENCOUNTER — Other Ambulatory Visit (HOSPITAL_COMMUNITY): Payer: Self-pay | Admitting: Medical

## 2015-07-12 ENCOUNTER — Other Ambulatory Visit (HOSPITAL_COMMUNITY): Payer: Self-pay | Admitting: Medical

## 2015-07-14 ENCOUNTER — Other Ambulatory Visit (HOSPITAL_COMMUNITY): Payer: Self-pay | Admitting: Medical

## 2015-08-07 ENCOUNTER — Other Ambulatory Visit (HOSPITAL_COMMUNITY): Payer: Self-pay | Admitting: Physician Assistant

## 2015-08-07 DIAGNOSIS — R7989 Other specified abnormal findings of blood chemistry: Secondary | ICD-10-CM

## 2015-08-07 DIAGNOSIS — R945 Abnormal results of liver function studies: Secondary | ICD-10-CM

## 2015-08-07 DIAGNOSIS — R160 Hepatomegaly, not elsewhere classified: Secondary | ICD-10-CM

## 2015-08-10 ENCOUNTER — Ambulatory Visit (INDEPENDENT_AMBULATORY_CARE_PROVIDER_SITE_OTHER): Payer: 59 | Admitting: Medical

## 2015-08-10 ENCOUNTER — Encounter (HOSPITAL_COMMUNITY): Payer: Self-pay | Admitting: Medical

## 2015-08-10 VITALS — BP 124/70 | HR 81 | Ht 63.0 in | Wt 173.0 lb

## 2015-08-10 DIAGNOSIS — F3162 Bipolar disorder, current episode mixed, moderate: Secondary | ICD-10-CM

## 2015-08-10 DIAGNOSIS — F1020 Alcohol dependence, uncomplicated: Secondary | ICD-10-CM

## 2015-08-10 DIAGNOSIS — F5104 Psychophysiologic insomnia: Secondary | ICD-10-CM

## 2015-08-10 DIAGNOSIS — G4700 Insomnia, unspecified: Secondary | ICD-10-CM

## 2015-08-10 DIAGNOSIS — F4312 Post-traumatic stress disorder, chronic: Secondary | ICD-10-CM

## 2015-08-10 DIAGNOSIS — Z62810 Personal history of physical and sexual abuse in childhood: Secondary | ICD-10-CM

## 2015-08-10 MED ORDER — ARIPIPRAZOLE 30 MG PO TABS: ORAL_TABLET | ORAL | Status: DC

## 2015-08-10 MED ORDER — LAMOTRIGINE 200 MG PO TABS: 200.0000 mg | ORAL_TABLET | Freq: Every day | ORAL | Status: DC

## 2015-08-10 MED ORDER — TRAZODONE HCL 100 MG PO TABS: 200.0000 mg | ORAL_TABLET | Freq: Every day | ORAL | Status: DC

## 2015-08-10 MED ORDER — QUETIAPINE FUMARATE 100 MG PO TABS: ORAL_TABLET | ORAL | Status: DC
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
## 2015-08-29 ENCOUNTER — Ambulatory Visit (HOSPITAL_COMMUNITY)
Admission: RE | Admit: 2015-08-29 | Discharge: 2015-08-29 | Disposition: A | Discharge status: Home / Self Care | Payer: 59 | Source: Ambulatory Visit | Attending: Physician Assistant | Admitting: Physician Assistant

## 2015-08-29 DIAGNOSIS — R160 Hepatomegaly, not elsewhere classified: Secondary | ICD-10-CM | POA: Insufficient documentation

## 2015-08-29 DIAGNOSIS — R945 Abnormal results of liver function studies: Secondary | ICD-10-CM

## 2015-08-29 DIAGNOSIS — R932 Abnormal findings on diagnostic imaging of liver and biliary tract: Secondary | ICD-10-CM | POA: Diagnosis not present

## 2015-08-29 DIAGNOSIS — R7989 Other specified abnormal findings of blood chemistry: Secondary | ICD-10-CM | POA: Diagnosis not present

## 2015-08-31 ENCOUNTER — Ambulatory Visit (INDEPENDENT_AMBULATORY_CARE_PROVIDER_SITE_OTHER): Payer: 59 | Admitting: Medical

## 2015-08-31 ENCOUNTER — Encounter (HOSPITAL_COMMUNITY): Payer: Self-pay | Admitting: Medical

## 2015-08-31 VITALS — BP 128/74 | HR 102 | Ht 63.0 in | Wt 173.0 lb

## 2015-08-31 DIAGNOSIS — F4312 Post-traumatic stress disorder, chronic: Secondary | ICD-10-CM

## 2015-08-31 DIAGNOSIS — F3162 Bipolar disorder, current episode mixed, moderate: Secondary | ICD-10-CM | POA: Diagnosis not present

## 2015-08-31 DIAGNOSIS — F1020 Alcohol dependence, uncomplicated: Secondary | ICD-10-CM | POA: Diagnosis not present
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
## 2015-09-14 ENCOUNTER — Telehealth (HOSPITAL_COMMUNITY): Payer: Self-pay | Admitting: Medical

## 2015-09-14 NOTE — Telephone Encounter (Signed)
Return telephone call to pt. Pt express concerns about anxiety medication. Pt states her current medications are not working. Informed pt medication changes are completed at visits. If symptoms worsen, pt may proceed to the nearest emergency room. Offered pt an earlier appt, pt is schedule for 09/15/15.

## 2015-09-15 ENCOUNTER — Ambulatory Visit (HOSPITAL_COMMUNITY): Payer: Self-pay | Admitting: Medical

## 2015-09-15 NOTE — Telephone Encounter (Signed)
Pt called at 10:40 day of the appointment to reschedule appt for 09/21/15. Pt did not give a reason for rescheduling appt. Pt states she would like to send a medication to pharmacy for anxiety. Informed pt medication changes are conducted at the time of visit. If her symptoms are worsen, proceed to local ED or urgent care. Pt verbalizes understanding.  Pt states she will wait until appt on 09/21/15.

## 2015-09-15 NOTE — Telephone Encounter (Signed)
Please see previous note.

## 2015-09-21 ENCOUNTER — Ambulatory Visit (HOSPITAL_COMMUNITY): Payer: Self-pay | Admitting: Medical

## 2015-10-09 ENCOUNTER — Encounter: Payer: 59 | Attending: Family Medicine | Admitting: *Deleted

## 2015-10-09 ENCOUNTER — Encounter: Payer: Self-pay | Admitting: *Deleted

## 2015-10-09 DIAGNOSIS — Z029 Encounter for administrative examinations, unspecified: Secondary | ICD-10-CM | POA: Insufficient documentation

## 2015-10-09 DIAGNOSIS — K7030 Alcoholic cirrhosis of liver without ascites: Secondary | ICD-10-CM

## 2015-10-09 NOTE — Progress Notes (Signed)
  Medical Nutrition Therapy:  Appt start time: 0915 end time:  1000.  Assessment:  Primary concerns today: Denisha is here for nutrition counseling pertaining to referral for cirrhosis and her concern for "fatty liver".   States she is a recovering alcoholic and states she loves sweets.  Per medical record she is not experiencing any edema or ascites at this point and has early stages of cirrhosis.  She is interested in learning how to eat healthier and state she is ready to make changes.  She does the grocery shopping and cooking for herself.  She states she mostly bakes and fries foods.  She eats out 5 days/week: fast food, [employer].  When at home she eats in the living room while watching tv. She is a fast eater.  When asked about any foods she avoids, her response is "healthy foods." States she tries to avoid salty foods, but she routinely eats frozen meals or fast food.  She is concerned about her weight  Preferred Learning Style:   No preference indicated   Learning Readiness:   Ready   MEDICATIONS: see list   DIETARY INTAKE:  Usual eating pattern includes 3 meals and 3 snacks per day.  24-hr recall:  B ( AM): 2 bananas  Snk ( AM): none  L ( PM): pizza Snk ( PM): 2 donuts D ( PM): cheeseburger Snk ( PM): none Beverages: diet coke (all day) and some water  Usual physical activity: exercises 3 days/week: 2 miles walk/jog for 1 month so far.  Has gym at work  Estimated energy needs: 2000 calories    Nutritional Diagnosis:  NB-1.1 Food and nutrition-related knowledge deficit As related to proper balance of nutrients for healthy meal planning.  As evidenced by dietary recall.    Intervention:  Nutrition counseling provided. Discussed HAES principles and advised focusing on health, not weight.  Discussed MyPlate recommendations for meal planning, focusing on increasing fiber from whole grains, fruits and vegetables, as well as decreasing sodium and fat.  Advised gradually  reducing the amount of fast food consumed due to sodium content.  Recommended eating at home using crockpot for convenience.  Discussed metabolic effects of dieting and discouraged this.  Suggested mindful eating: eating without distraction and eating until comfortable, but not stuffed.  Recommended increasing water vs diet soda and adding strength training to exercise regimen.    Teaching Method Utilized: Visual Auditory   Handouts given during visit include:  MyPlate  MNT for cirrhosis and MNT for TLC diet (low sodium, low fat)  Barriers to learning/adherence to lifestyle change: none  Demonstrated degree of understanding via:  Teach Back   Monitoring/Evaluation:  Dietary intake, exercise, and body weight in 1 month(s). Per patient request

## 2015-10-09 NOTE — Patient Instructions (Signed)
Continue 3 day/day, snack as needed Eat without distractions, pay attention to hunger/fullness cue.  Stop eating when comfortable Drink 1 oz water for every oz diet soda Increase fruits and veggies all the time, especially when eating out Gradually decrease the eating out Increase whole grains like brown rice, oatmeal, whole wheat bread, etc Add strength training 2 days/week

## 2015-11-06 ENCOUNTER — Ambulatory Visit: Payer: Self-pay | Admitting: *Deleted

## 2015-11-16 ENCOUNTER — Other Ambulatory Visit (HOSPITAL_COMMUNITY): Payer: Self-pay | Admitting: Medical

## 2015-11-17 ENCOUNTER — Other Ambulatory Visit (HOSPITAL_COMMUNITY): Payer: Self-pay | Admitting: Medical

## 2015-11-23 ENCOUNTER — Ambulatory Visit (HOSPITAL_COMMUNITY): Payer: Self-pay | Admitting: Medical

## 2015-12-04 ENCOUNTER — Other Ambulatory Visit (HOSPITAL_COMMUNITY): Payer: Self-pay | Admitting: Medical

## 2015-12-07 ENCOUNTER — Ambulatory Visit (HOSPITAL_COMMUNITY): Payer: Self-pay | Admitting: Medical

## 2015-12-07 ENCOUNTER — Telehealth (HOSPITAL_COMMUNITY): Payer: Self-pay | Admitting: Medical

## 2015-12-07 MED ORDER — QUETIAPINE FUMARATE 100 MG PO TABS: ORAL_TABLET | ORAL | Status: DC

## 2015-12-07 MED ORDER — TRAZODONE HCL 100 MG PO TABS: 200.0000 mg | ORAL_TABLET | Freq: Every day | ORAL | Status: DC

## 2015-12-07 NOTE — Telephone Encounter (Signed)
Pt called for refills Seroquel and Trazodone. Per Darlyne Russian, PA, refills for Seroquel 100mg , #60 and Trazodone 100mg , #60 were authorized and sent to [facility]). Pt is schedule for a f/u appt on 6/29. Called to leave voicemail, pt has not set up voicemail.

## 2015-12-14 ENCOUNTER — Encounter (HOSPITAL_COMMUNITY): Payer: Self-pay | Admitting: Medical

## 2015-12-14 ENCOUNTER — Ambulatory Visit (INDEPENDENT_AMBULATORY_CARE_PROVIDER_SITE_OTHER): Payer: 59 | Admitting: Medical

## 2015-12-14 VITALS — BP 128/80 | HR 97 | Ht 63.0 in | Wt 177.0 lb

## 2015-12-14 DIAGNOSIS — F3175 Bipolar disorder, in partial remission, most recent episode depressed: Secondary | ICD-10-CM

## 2015-12-14 DIAGNOSIS — Z62810 Personal history of physical and sexual abuse in childhood: Secondary | ICD-10-CM | POA: Diagnosis not present

## 2015-12-14 DIAGNOSIS — K7010 Alcoholic hepatitis without ascites: Secondary | ICD-10-CM | POA: Diagnosis not present

## 2015-12-14 DIAGNOSIS — G4700 Insomnia, unspecified: Secondary | ICD-10-CM

## 2015-12-14 DIAGNOSIS — F1021 Alcohol dependence, in remission: Secondary | ICD-10-CM | POA: Diagnosis not present

## 2015-12-14 DIAGNOSIS — F5104 Psychophysiologic insomnia: Secondary | ICD-10-CM

## 2015-12-14 DIAGNOSIS — F4312 Post-traumatic stress disorder, chronic: Secondary | ICD-10-CM | POA: Diagnosis not present

## 2015-12-14 DIAGNOSIS — Z8719 Personal history of other diseases of the digestive system: Secondary | ICD-10-CM | POA: Diagnosis not present

## 2015-12-14 MED ORDER — QUETIAPINE FUMARATE 300 MG PO TABS: ORAL_TABLET | ORAL | Status: AC

## 2015-12-14 MED ORDER — BUPROPION HCL ER (SR) 100 MG PO TB12: 100.0000 mg | ORAL_TABLET | Freq: Two times a day (BID) | ORAL | Status: AC

## 2015-12-14 MED ORDER — ARIPIPRAZOLE 30 MG PO TABS
ORAL_TABLET | ORAL | Status: AC
Start: 2015-12-14 — End: ?

## 2015-12-14 MED ORDER — LAMOTRIGINE 200 MG PO TABS: 200.0000 mg | ORAL_TABLET | Freq: Every day | ORAL | Status: AC
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
## 2016-01-11 ENCOUNTER — Ambulatory Visit (HOSPITAL_COMMUNITY): Payer: Self-pay | Admitting: Medical

## 2016-01-11 ENCOUNTER — Encounter (HOSPITAL_COMMUNITY): Payer: Self-pay | Admitting: Medical

## 2016-02-12 IMAGING — CT CT HEAD W/O CM
2 series · 17 of 30 positions shown, 20 images · non-contrast
Comparison: None.

CLINICAL DATA: Nystagmus.  Dizzy.  Aggressive behavior.

EXAM:
CT HEAD WITHOUT CONTRAST
TECHNIQUE: Contiguous axial images were obtained from the base of the skull
through the vertex without intravenous contrast.

[Series 2: head w/o · axial · non-contrast · 0.45mm/px · z∈[-131,-16]mm · 9 of 29 slices shown, 12 images]
[im 3/29  brain]
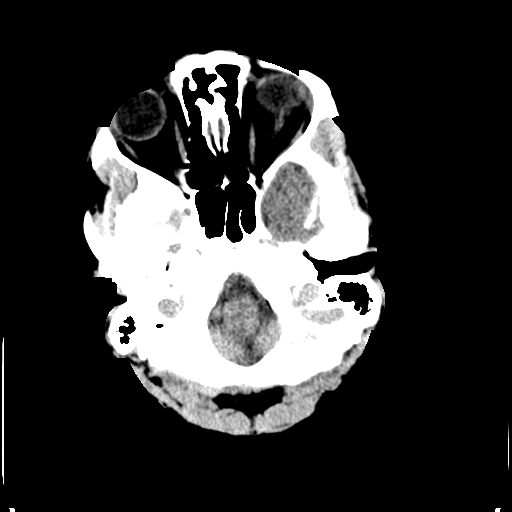
[im 3/29  bone]
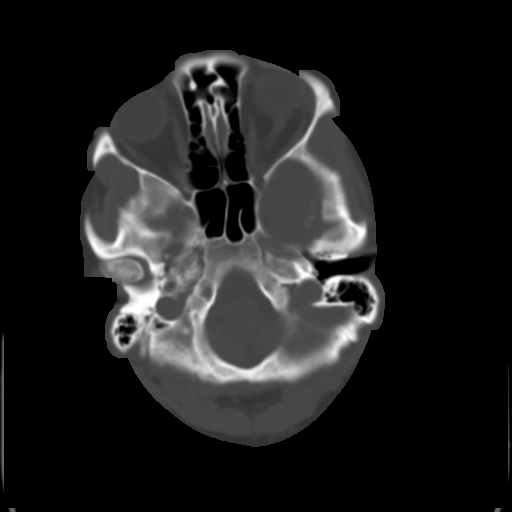
[im 6/29  brain]
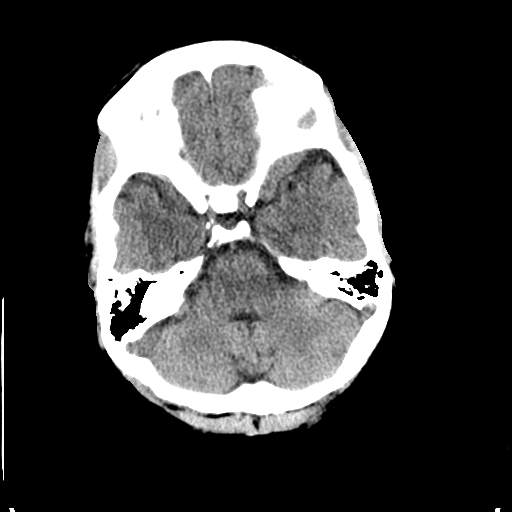
[im 9/29  brain]
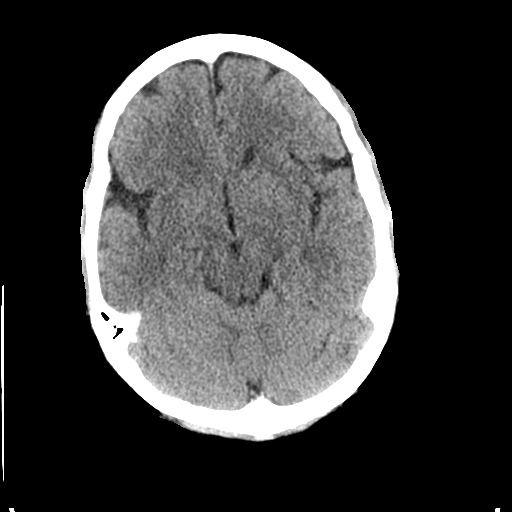
[im 12/29  brain]
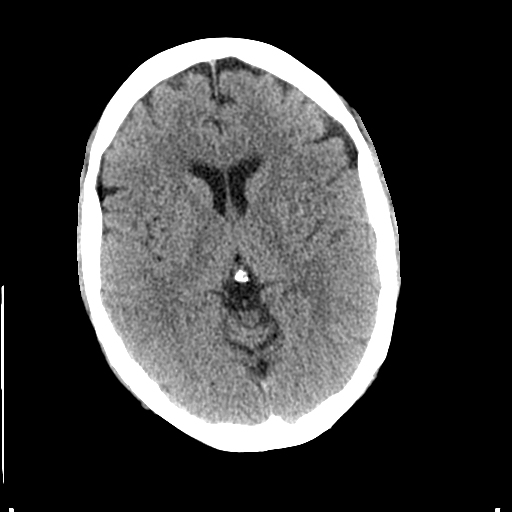
[im 15/29  brain]
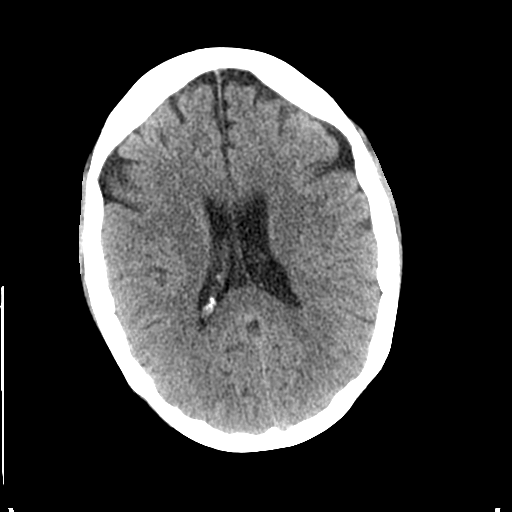
[im 15/29  bone]
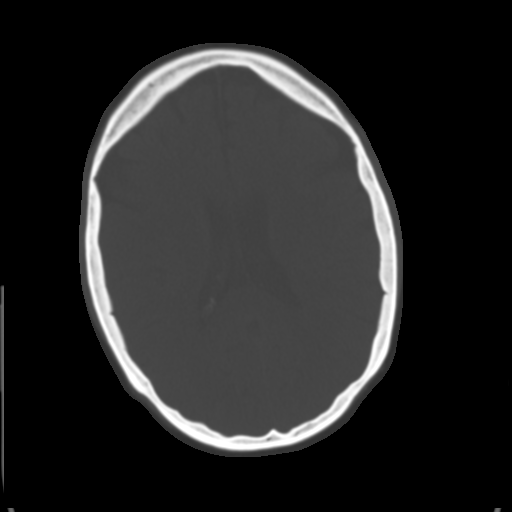
[im 17/29  brain]
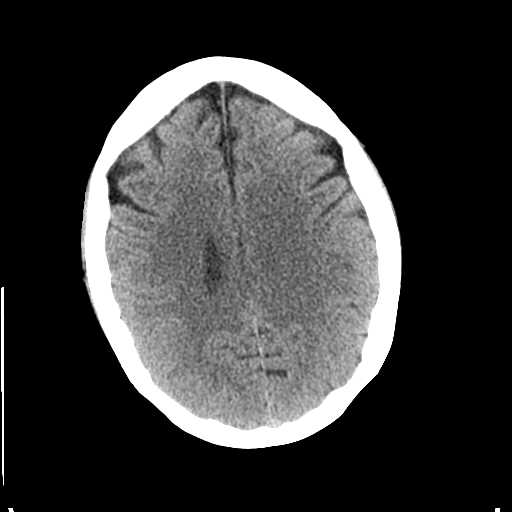
[im 20/29  brain]
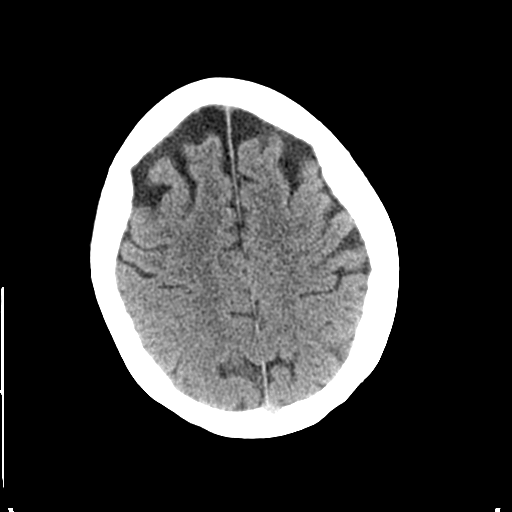
[im 23/29  brain]
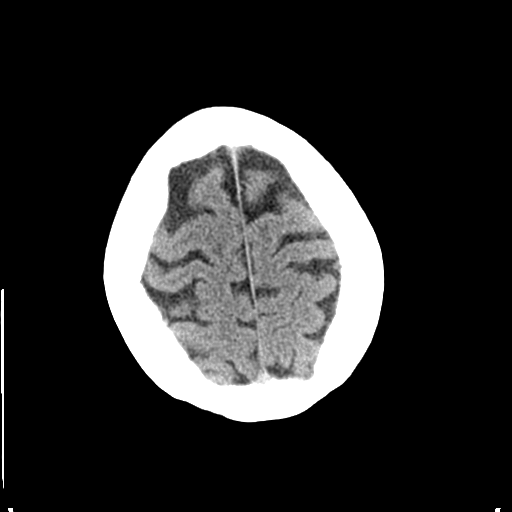
[im 26/29  brain]
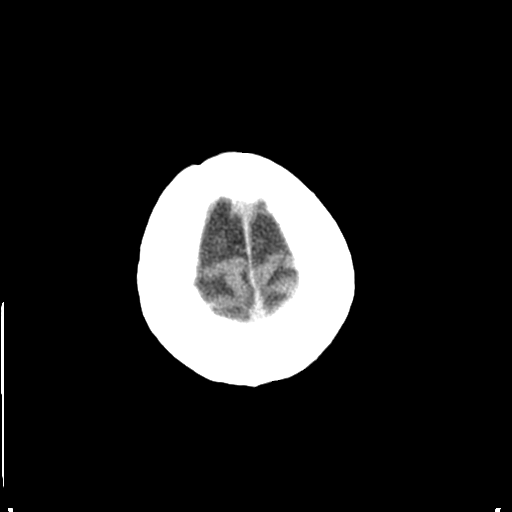
[im 26/29  bone]
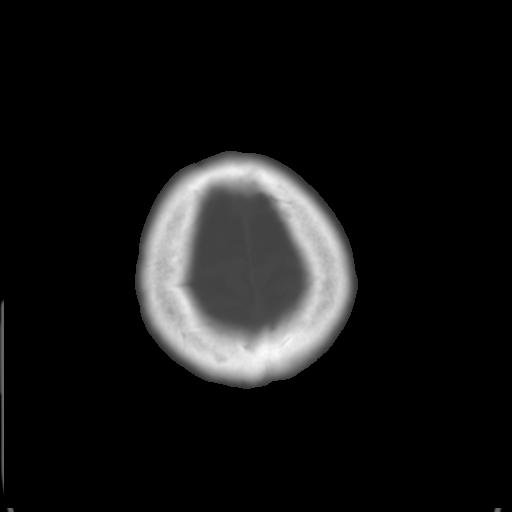

[Series 3: bone windows · axial · 0.45mm/px · z∈[-126,-18]mm · 8 of 48 slices shown]
[im 6/48  bone]
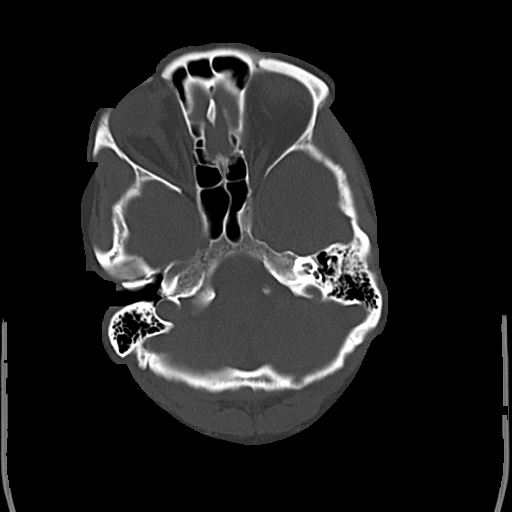
[im 11/48  bone]
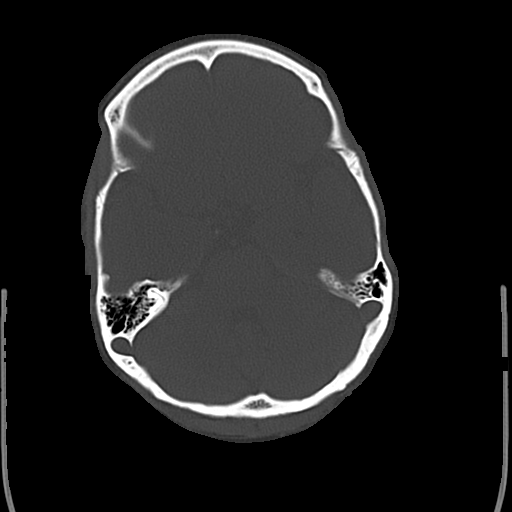
[im 16/48  bone]
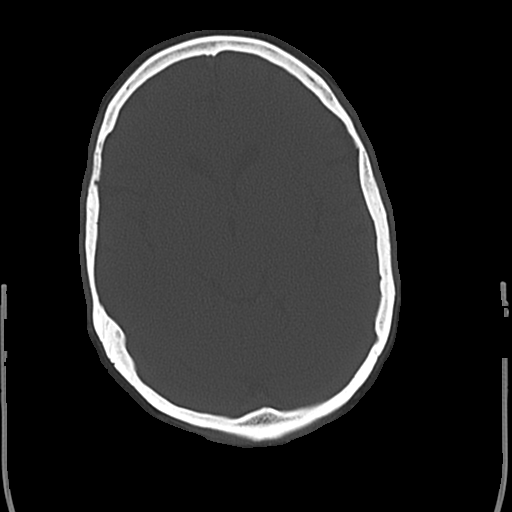
[im 21/48  bone]
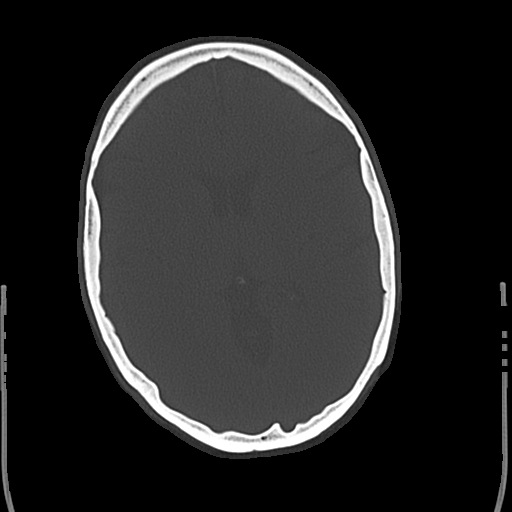
[im 27/48  bone]
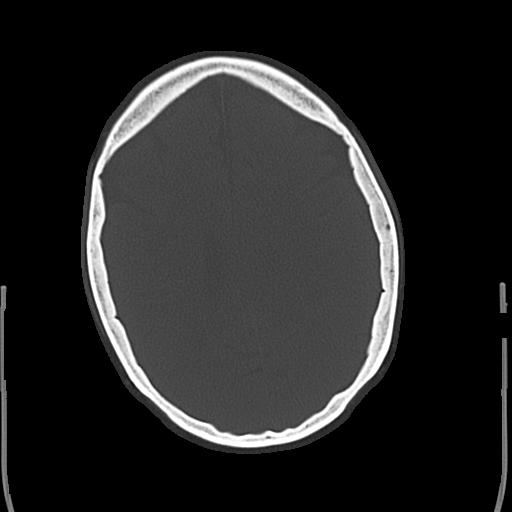
[im 32/48  bone]
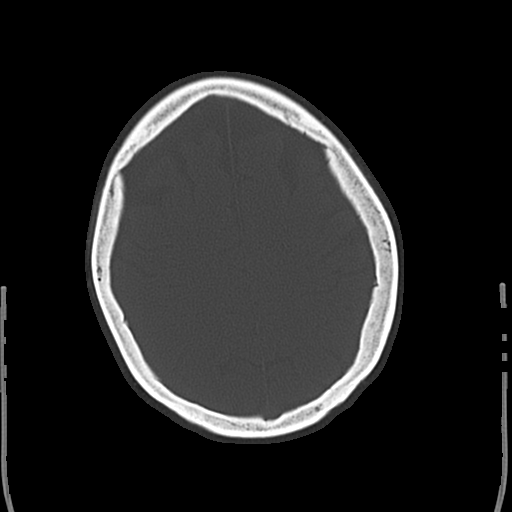
[im 37/48  bone]
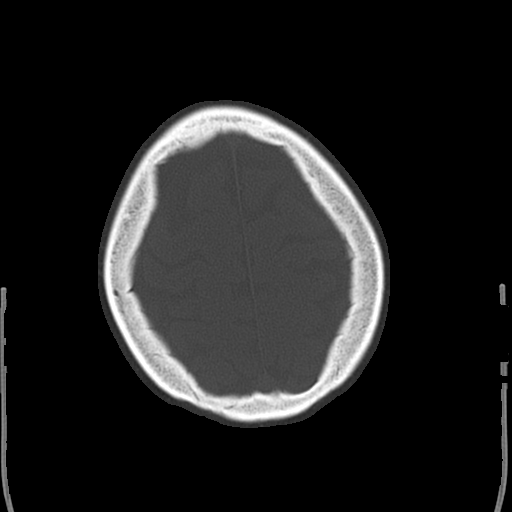
[im 42/48  bone]
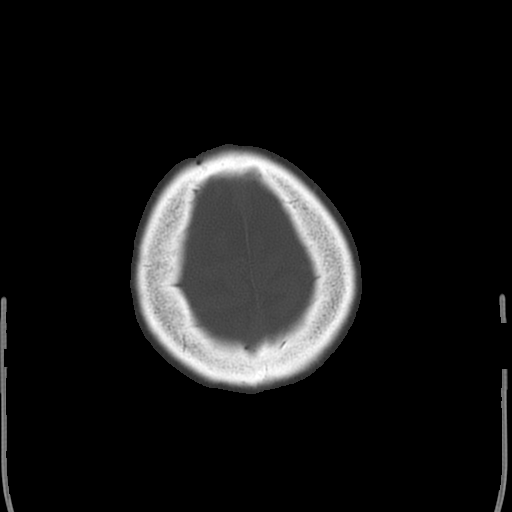

[17 of 30 positions shown; findings below may reference images not displayed]

FINDINGS: No acute cortical infarct, hemorrhage, or mass lesion is present.
The ventricles are of normal size. No significant extra-axial fluid
collection is evident. The paranasal sinuses and mastoid air cells
are clear. The calvarium is intact.
IMPRESSION: Negative CT of the head.

## 2016-02-12 IMAGING — CT CT ABD-PELV W/O CM
2 of 4 series · 16 of 46 positions shown, 18 images · non-contrast
Comparison: CT scan dated 07/27/2014 and abdominal MRI dated
10/09/2014

CLINICAL DATA: Acute right flank pain.

EXAM:
CT ABDOMEN AND PELVIS WITHOUT CONTRAST
TECHNIQUE: Multidetector CT imaging of the abdomen and pelvis was performed
following the standard protocol without IV contrast.

[Series 2: abd/pel w/o · axial · non-contrast · 0.70mm/px · z∈[-862,-412]mm · 13 of 98 slices shown, 15 images]
[im 4/98  soft-tissue]
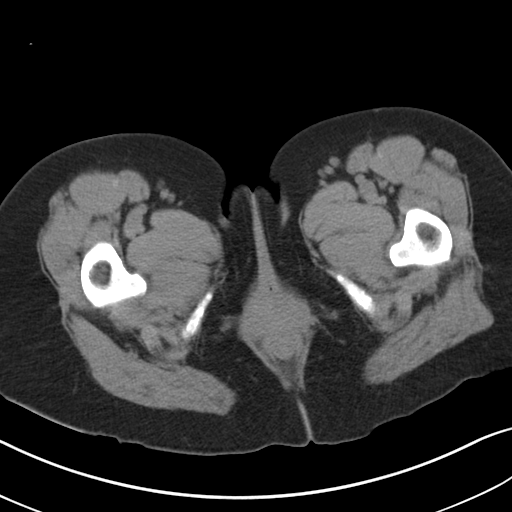
[im 4/98  bone]
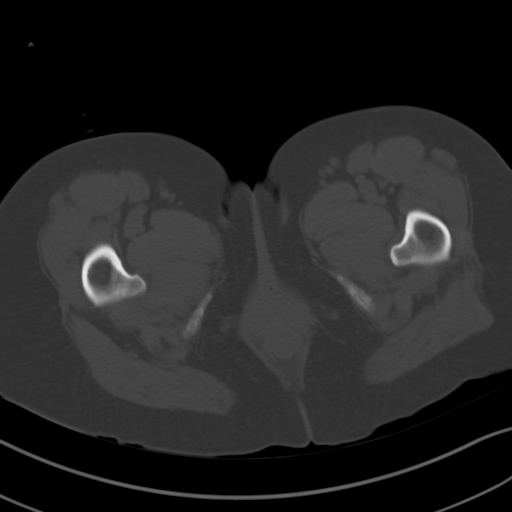
[im 12/98  soft-tissue]
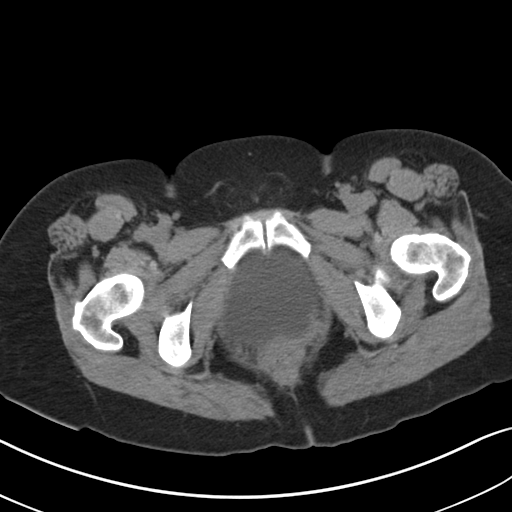
[im 20/98  soft-tissue]
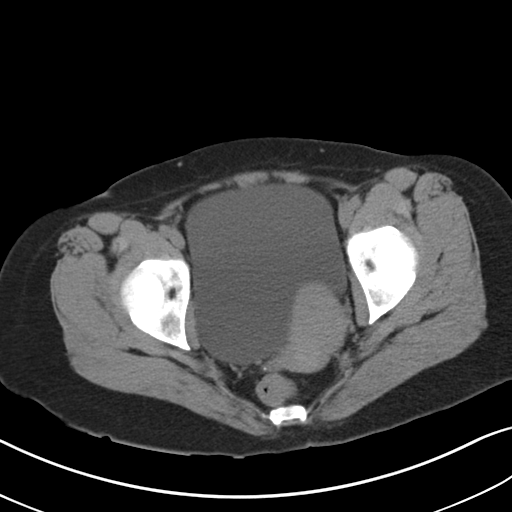
[im 28/98  soft-tissue]
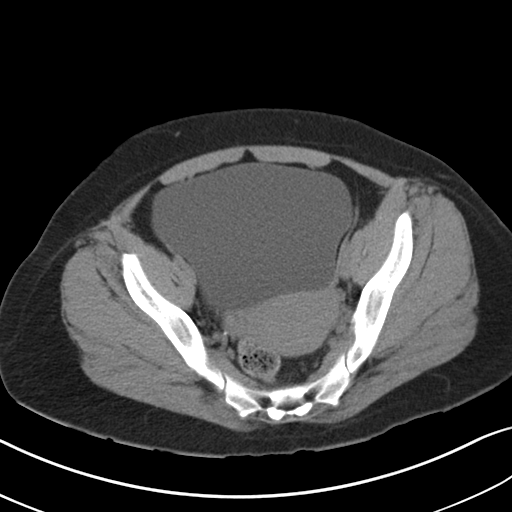
[im 35/98  soft-tissue]
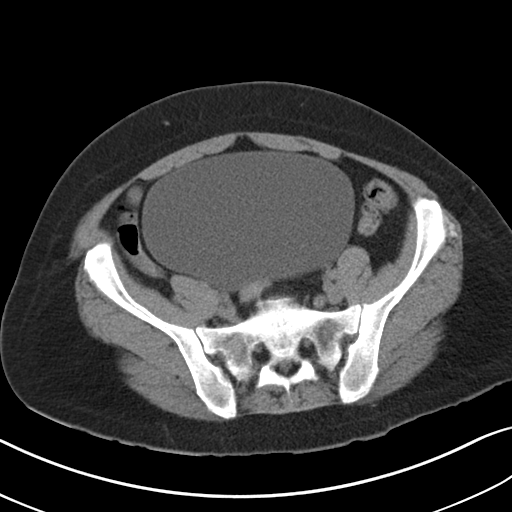
[im 43/98  soft-tissue]
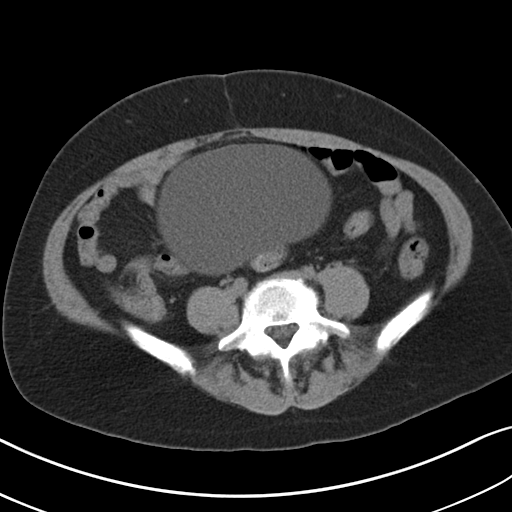
[im 51/98  soft-tissue]
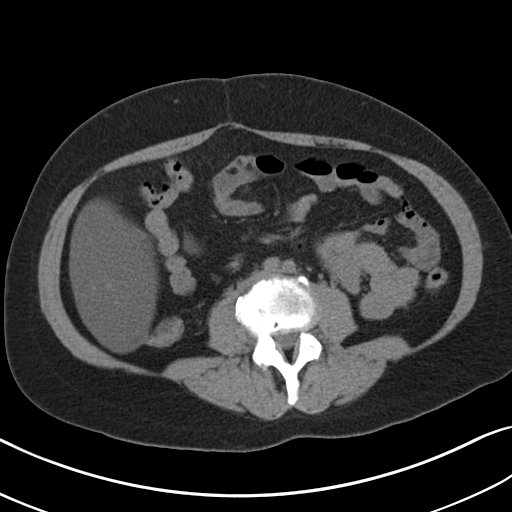
[im 55/98  soft-tissue]
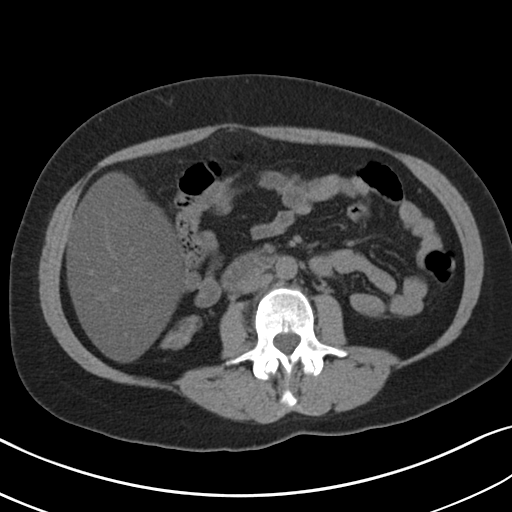
[im 63/98  soft-tissue]
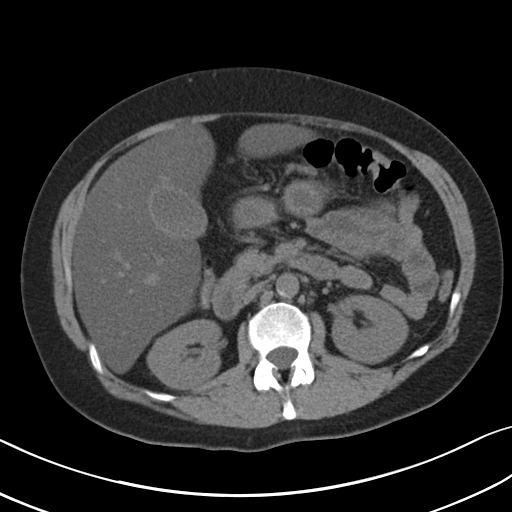
[im 63/98  bone]
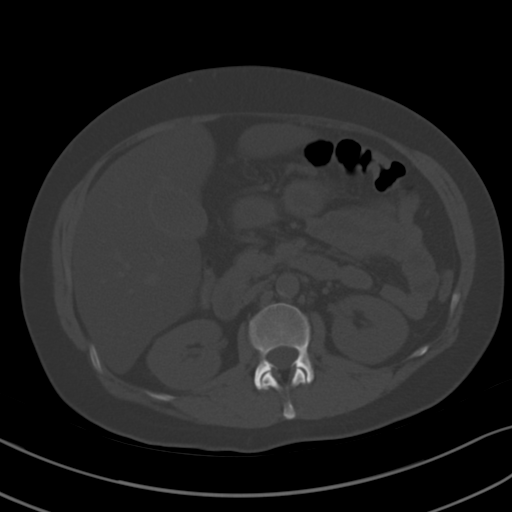
[im 70/98  soft-tissue]
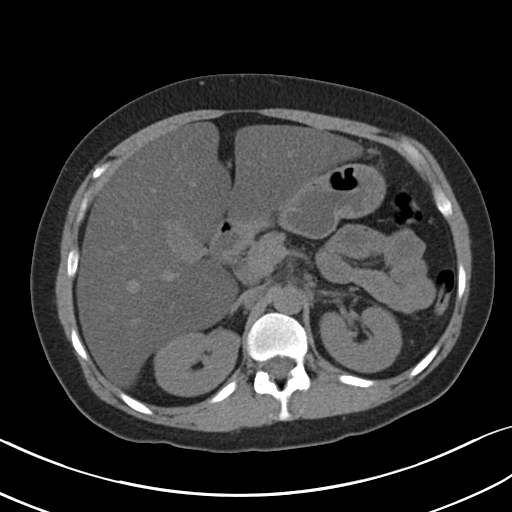
[im 78/98  soft-tissue]
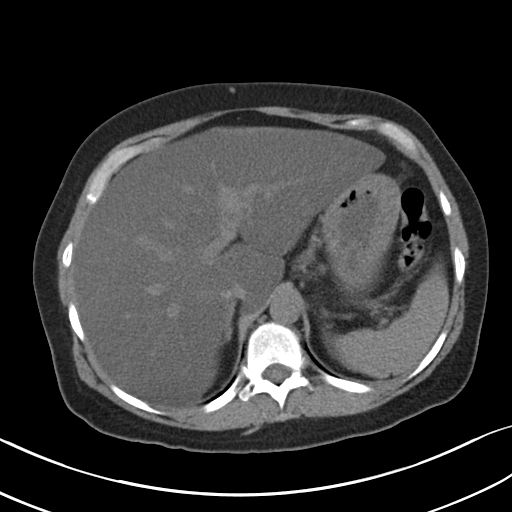
[im 86/98  soft-tissue]
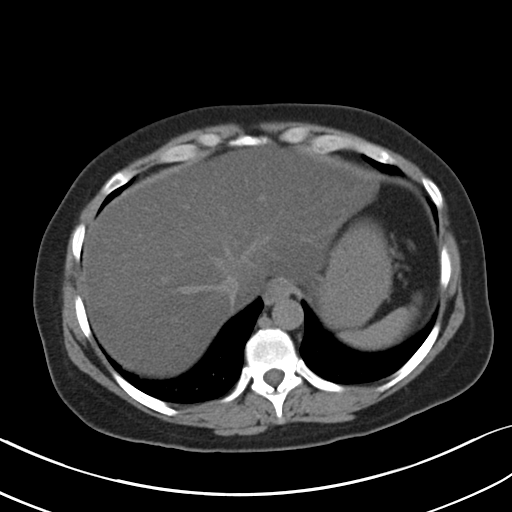
[im 94/98  soft-tissue]
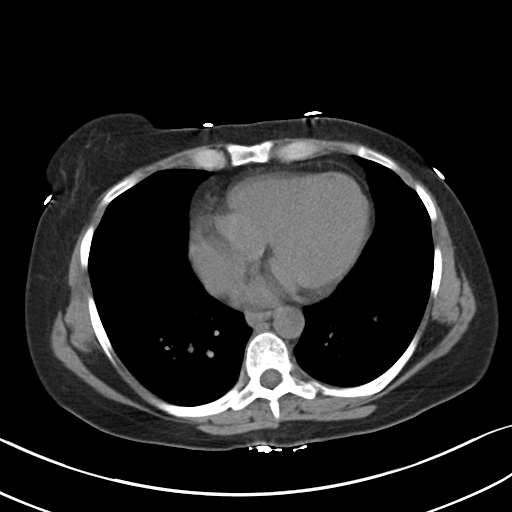

[Series 4: coronal · coronal · 0.74mm/px · 3 of 95 slices shown]
[im 32/95  soft-tissue]
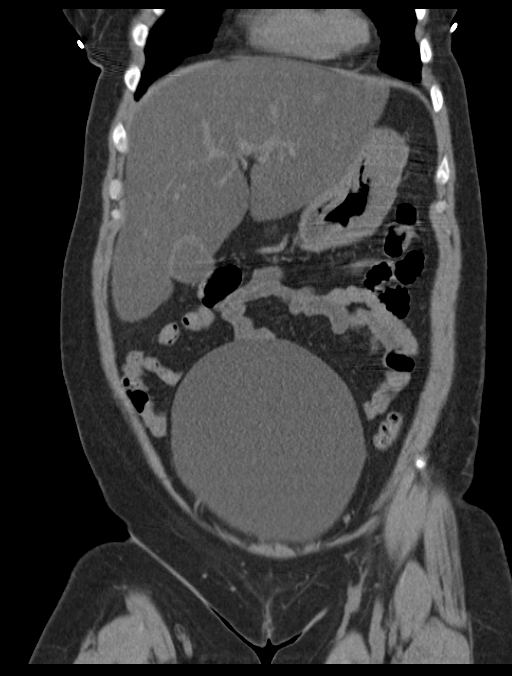
[im 42/95  soft-tissue]
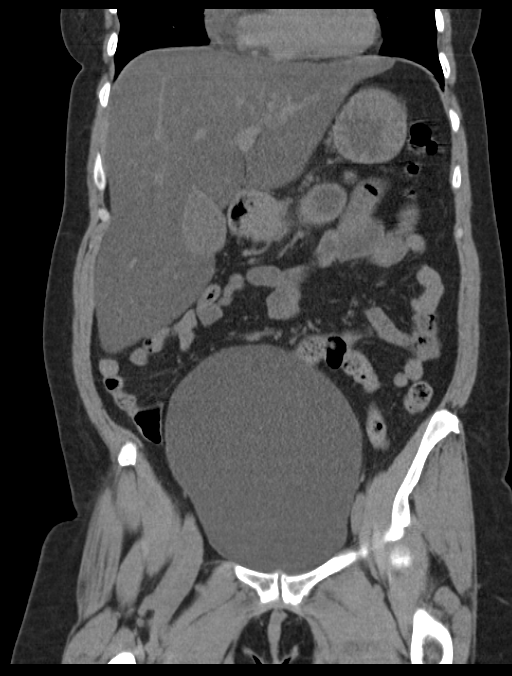
[im 53/95  soft-tissue]
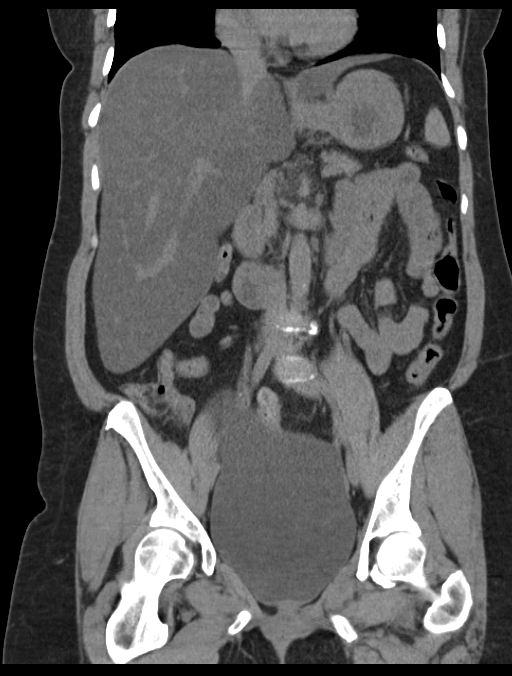

[16 of 46 positions shown; findings below may reference images not displayed]

FINDINGS: Lower chest:  Normal.

Hepatobiliary: Hepatomegaly with extensive hepatic steatosis. The
2.1 cm enhancing lesion in the posterior aspect of the right lobe
previously demonstrated to be consistent with an adenoma.

Pancreas: Normal.

Spleen: Normal.

Adrenals/Urinary Tract: Normal kidneys and ureters in adrenal
glands. The bladder is markedly distended above the umbilicus.

Stomach/Bowel: Normal.  The appendix has been removed.

Vascular/Lymphatic: Scattered calcifications in the abdominal aorta.

Reproductive: 5 cm fibroid in the left side of the uterus. Right
ovary is normal. Left ovary is not discretely identified.

Other: No free fluid.  No free air.

Musculoskeletal: Degenerative disc disease at L3-4.
IMPRESSION: Marked distention of the bladder.

Hepatomegaly with hepatic steatosis. Stable adenoma in the right
lobe of the liver.

5 cm fibroid in the left side of the uterus.
